# Patient Record
Sex: Female | Born: 1960
Health system: Southern US, Community
[De-identification: ages and names within clinical notes are randomized; demographics above are authoritative.]

## PROBLEM LIST (undated history)

## (undated) DIAGNOSIS — E46 Unspecified protein-calorie malnutrition: Secondary | ICD-10-CM

## (undated) DIAGNOSIS — S82832A Other fracture of upper and lower end of left fibula, initial encounter for closed fracture: Secondary | ICD-10-CM

## (undated) DIAGNOSIS — M1990 Unspecified osteoarthritis, unspecified site: Secondary | ICD-10-CM

## (undated) DIAGNOSIS — E559 Vitamin D deficiency, unspecified: Secondary | ICD-10-CM

## (undated) DIAGNOSIS — F1190 Opioid use, unspecified, uncomplicated: Secondary | ICD-10-CM

## (undated) DIAGNOSIS — I10 Essential (primary) hypertension: Secondary | ICD-10-CM

## (undated) DIAGNOSIS — S82872A Displaced pilon fracture of left tibia, initial encounter for closed fracture: Secondary | ICD-10-CM

## (undated) DIAGNOSIS — G43909 Migraine, unspecified, not intractable, without status migrainosus: Secondary | ICD-10-CM

## (undated) HISTORY — PX: LAPAROSCOPIC CHOLECYSTECTOMY: SUR755

---

## 2017-01-31 DIAGNOSIS — I10 Essential (primary) hypertension: Secondary | ICD-10-CM | POA: Diagnosis not present

## 2017-01-31 DIAGNOSIS — E7800 Pure hypercholesterolemia, unspecified: Secondary | ICD-10-CM | POA: Diagnosis not present

## 2017-01-31 DIAGNOSIS — R7309 Other abnormal glucose: Secondary | ICD-10-CM | POA: Diagnosis not present

## 2017-01-31 DIAGNOSIS — Z0000 Encounter for general adult medical examination without abnormal findings: Secondary | ICD-10-CM | POA: Diagnosis not present

## 2017-01-31 DIAGNOSIS — Z1211 Encounter for screening for malignant neoplasm of colon: Secondary | ICD-10-CM | POA: Diagnosis not present

## 2017-01-31 DIAGNOSIS — M1610 Unilateral primary osteoarthritis, unspecified hip: Secondary | ICD-10-CM | POA: Diagnosis not present

## 2017-07-25 DIAGNOSIS — Z23 Encounter for immunization: Secondary | ICD-10-CM | POA: Diagnosis not present

## 2017-07-25 DIAGNOSIS — M1610 Unilateral primary osteoarthritis, unspecified hip: Secondary | ICD-10-CM | POA: Diagnosis not present

## 2017-07-25 DIAGNOSIS — I10 Essential (primary) hypertension: Secondary | ICD-10-CM | POA: Diagnosis not present

## 2017-12-25 DIAGNOSIS — Z1231 Encounter for screening mammogram for malignant neoplasm of breast: Secondary | ICD-10-CM | POA: Diagnosis not present

## 2017-12-25 DIAGNOSIS — Z6841 Body Mass Index (BMI) 40.0 and over, adult: Secondary | ICD-10-CM | POA: Diagnosis not present

## 2017-12-25 DIAGNOSIS — M1610 Unilateral primary osteoarthritis, unspecified hip: Secondary | ICD-10-CM | POA: Diagnosis not present

## 2017-12-25 DIAGNOSIS — I10 Essential (primary) hypertension: Secondary | ICD-10-CM | POA: Diagnosis not present

## 2018-05-21 DIAGNOSIS — I10 Essential (primary) hypertension: Secondary | ICD-10-CM | POA: Diagnosis not present

## 2018-05-21 DIAGNOSIS — M25552 Pain in left hip: Secondary | ICD-10-CM | POA: Diagnosis not present

## 2018-05-21 DIAGNOSIS — Z1231 Encounter for screening mammogram for malignant neoplasm of breast: Secondary | ICD-10-CM | POA: Diagnosis not present

## 2018-07-17 DIAGNOSIS — R600 Localized edema: Secondary | ICD-10-CM | POA: Diagnosis not present

## 2018-07-17 DIAGNOSIS — R0602 Shortness of breath: Secondary | ICD-10-CM | POA: Diagnosis not present

## 2018-07-17 DIAGNOSIS — R609 Edema, unspecified: Secondary | ICD-10-CM | POA: Diagnosis not present

## 2018-07-17 DIAGNOSIS — R000 Tachycardia, unspecified: Secondary | ICD-10-CM | POA: Diagnosis not present

## 2018-07-17 DIAGNOSIS — R0902 Hypoxemia: Secondary | ICD-10-CM | POA: Diagnosis not present

## 2018-07-17 DIAGNOSIS — R52 Pain, unspecified: Secondary | ICD-10-CM | POA: Diagnosis not present

## 2018-07-17 DIAGNOSIS — Z87891 Personal history of nicotine dependence: Secondary | ICD-10-CM | POA: Diagnosis not present

## 2018-07-28 DIAGNOSIS — R609 Edema, unspecified: Secondary | ICD-10-CM | POA: Diagnosis not present

## 2018-07-28 DIAGNOSIS — R0602 Shortness of breath: Secondary | ICD-10-CM | POA: Diagnosis not present

## 2018-07-28 DIAGNOSIS — R0609 Other forms of dyspnea: Secondary | ICD-10-CM | POA: Diagnosis not present

## 2018-08-14 DIAGNOSIS — R600 Localized edema: Secondary | ICD-10-CM | POA: Diagnosis not present

## 2018-08-14 DIAGNOSIS — N6321 Unspecified lump in the left breast, upper outer quadrant: Secondary | ICD-10-CM | POA: Diagnosis not present

## 2018-08-23 DIAGNOSIS — Z79891 Long term (current) use of opiate analgesic: Secondary | ICD-10-CM | POA: Diagnosis not present

## 2018-08-23 DIAGNOSIS — M160 Bilateral primary osteoarthritis of hip: Secondary | ICD-10-CM | POA: Diagnosis not present

## 2018-08-23 DIAGNOSIS — M25572 Pain in left ankle and joints of left foot: Secondary | ICD-10-CM | POA: Diagnosis not present

## 2018-08-23 DIAGNOSIS — S8992XA Unspecified injury of left lower leg, initial encounter: Secondary | ICD-10-CM | POA: Diagnosis not present

## 2018-08-23 DIAGNOSIS — S82832A Other fracture of upper and lower end of left fibula, initial encounter for closed fracture: Secondary | ICD-10-CM | POA: Diagnosis not present

## 2018-08-23 DIAGNOSIS — X58XXXA Exposure to other specified factors, initial encounter: Secondary | ICD-10-CM | POA: Diagnosis not present

## 2018-08-23 DIAGNOSIS — R52 Pain, unspecified: Secondary | ICD-10-CM | POA: Diagnosis not present

## 2018-08-23 DIAGNOSIS — S82839A Other fracture of upper and lower end of unspecified fibula, initial encounter for closed fracture: Secondary | ICD-10-CM | POA: Diagnosis not present

## 2018-08-23 DIAGNOSIS — S82302A Unspecified fracture of lower end of left tibia, initial encounter for closed fracture: Secondary | ICD-10-CM | POA: Diagnosis not present

## 2018-08-23 DIAGNOSIS — M79662 Pain in left lower leg: Secondary | ICD-10-CM | POA: Diagnosis not present

## 2018-08-23 DIAGNOSIS — Z79899 Other long term (current) drug therapy: Secondary | ICD-10-CM | POA: Diagnosis not present

## 2018-08-23 DIAGNOSIS — E039 Hypothyroidism, unspecified: Secondary | ICD-10-CM | POA: Diagnosis not present

## 2018-08-25 ENCOUNTER — Other Ambulatory Visit: Payer: Self-pay | Admitting: Orthopedic Surgery

## 2018-08-25 ENCOUNTER — Other Ambulatory Visit: Payer: Self-pay

## 2018-08-25 ENCOUNTER — Encounter (HOSPITAL_COMMUNITY): Payer: Self-pay | Admitting: General Practice

## 2018-08-25 ENCOUNTER — Inpatient Hospital Stay (HOSPITAL_COMMUNITY)
Admission: AD | Admit: 2018-08-25 | Discharge: 2018-09-01 | DRG: 493 | Disposition: A | Discharge status: Skilled Nursing Facility | Payer: Medicare HMO | Source: Other Acute Inpatient Hospital | Attending: Orthopedic Surgery | Admitting: Orthopedic Surgery

## 2018-08-25 DIAGNOSIS — M84372A Stress fracture, left ankle, initial encounter for fracture: Secondary | ICD-10-CM | POA: Diagnosis not present

## 2018-08-25 DIAGNOSIS — M160 Bilateral primary osteoarthritis of hip: Secondary | ICD-10-CM | POA: Diagnosis present

## 2018-08-25 DIAGNOSIS — M810 Age-related osteoporosis without current pathological fracture: Secondary | ICD-10-CM | POA: Diagnosis present

## 2018-08-25 DIAGNOSIS — I10 Essential (primary) hypertension: Secondary | ICD-10-CM | POA: Diagnosis not present

## 2018-08-25 DIAGNOSIS — Z9049 Acquired absence of other specified parts of digestive tract: Secondary | ICD-10-CM | POA: Diagnosis not present

## 2018-08-25 DIAGNOSIS — S82876D Nondisplaced pilon fracture of unspecified tibia, subsequent encounter for closed fracture with routine healing: Secondary | ICD-10-CM | POA: Diagnosis not present

## 2018-08-25 DIAGNOSIS — M159 Polyosteoarthritis, unspecified: Secondary | ICD-10-CM | POA: Diagnosis not present

## 2018-08-25 DIAGNOSIS — E612 Magnesium deficiency: Secondary | ICD-10-CM | POA: Diagnosis present

## 2018-08-25 DIAGNOSIS — E559 Vitamin D deficiency, unspecified: Secondary | ICD-10-CM | POA: Diagnosis present

## 2018-08-25 DIAGNOSIS — F1190 Opioid use, unspecified, uncomplicated: Secondary | ICD-10-CM | POA: Diagnosis present

## 2018-08-25 DIAGNOSIS — S82872D Displaced pilon fracture of left tibia, subsequent encounter for closed fracture with routine healing: Secondary | ICD-10-CM | POA: Diagnosis not present

## 2018-08-25 DIAGNOSIS — S82392D Other fracture of lower end of left tibia, subsequent encounter for closed fracture with routine healing: Secondary | ICD-10-CM | POA: Diagnosis not present

## 2018-08-25 DIAGNOSIS — Z6841 Body Mass Index (BMI) 40.0 and over, adult: Secondary | ICD-10-CM

## 2018-08-25 DIAGNOSIS — R531 Weakness: Secondary | ICD-10-CM | POA: Diagnosis not present

## 2018-08-25 DIAGNOSIS — S82842A Displaced bimalleolar fracture of left lower leg, initial encounter for closed fracture: Secondary | ICD-10-CM | POA: Diagnosis not present

## 2018-08-25 DIAGNOSIS — S82892A Other fracture of left lower leg, initial encounter for closed fracture: Secondary | ICD-10-CM

## 2018-08-25 DIAGNOSIS — Z751 Person awaiting admission to adequate facility elsewhere: Secondary | ICD-10-CM | POA: Diagnosis not present

## 2018-08-25 DIAGNOSIS — S82832D Other fracture of upper and lower end of left fibula, subsequent encounter for closed fracture with routine healing: Secondary | ICD-10-CM | POA: Diagnosis not present

## 2018-08-25 DIAGNOSIS — M2550 Pain in unspecified joint: Secondary | ICD-10-CM | POA: Diagnosis not present

## 2018-08-25 DIAGNOSIS — W19XXXA Unspecified fall, initial encounter: Secondary | ICD-10-CM | POA: Diagnosis present

## 2018-08-25 DIAGNOSIS — E46 Unspecified protein-calorie malnutrition: Secondary | ICD-10-CM | POA: Diagnosis present

## 2018-08-25 DIAGNOSIS — Z87891 Personal history of nicotine dependence: Secondary | ICD-10-CM | POA: Diagnosis not present

## 2018-08-25 DIAGNOSIS — Z7401 Bed confinement status: Secondary | ICD-10-CM | POA: Diagnosis not present

## 2018-08-25 DIAGNOSIS — S82872A Displaced pilon fracture of left tibia, initial encounter for closed fracture: Secondary | ICD-10-CM | POA: Diagnosis not present

## 2018-08-25 DIAGNOSIS — E8889 Other specified metabolic disorders: Secondary | ICD-10-CM | POA: Diagnosis present

## 2018-08-25 DIAGNOSIS — T1490XA Injury, unspecified, initial encounter: Secondary | ICD-10-CM

## 2018-08-25 DIAGNOSIS — Z79891 Long term (current) use of opiate analgesic: Secondary | ICD-10-CM

## 2018-08-25 DIAGNOSIS — D62 Acute posthemorrhagic anemia: Secondary | ICD-10-CM | POA: Diagnosis not present

## 2018-08-25 DIAGNOSIS — S82832A Other fracture of upper and lower end of left fibula, initial encounter for closed fracture: Secondary | ICD-10-CM

## 2018-08-25 DIAGNOSIS — R5381 Other malaise: Secondary | ICD-10-CM | POA: Diagnosis not present

## 2018-08-25 DIAGNOSIS — Z743 Need for continuous supervision: Secondary | ICD-10-CM | POA: Diagnosis not present

## 2018-08-25 DIAGNOSIS — R262 Difficulty in walking, not elsewhere classified: Secondary | ICD-10-CM | POA: Diagnosis not present

## 2018-08-25 DIAGNOSIS — M6281 Muscle weakness (generalized): Secondary | ICD-10-CM | POA: Diagnosis not present

## 2018-08-25 DIAGNOSIS — S82392A Other fracture of lower end of left tibia, initial encounter for closed fracture: Secondary | ICD-10-CM | POA: Diagnosis not present

## 2018-08-25 HISTORY — DX: Migraine, unspecified, not intractable, without status migrainosus: G43.909

## 2018-08-25 HISTORY — DX: Displaced pilon fracture of left tibia, initial encounter for closed fracture: S82.872A

## 2018-08-25 HISTORY — DX: Vitamin D deficiency, unspecified: E55.9

## 2018-08-25 HISTORY — DX: Displaced pilon fracture of left tibia, initial encounter for closed fracture: S82.832A

## 2018-08-25 HISTORY — DX: Unspecified osteoarthritis, unspecified site: M19.90

## 2018-08-25 HISTORY — DX: Unspecified protein-calorie malnutrition: E46

## 2018-08-25 HISTORY — DX: Other fracture of upper and lower end of left fibula, initial encounter for closed fracture: S82.832A

## 2018-08-25 HISTORY — DX: Morbid (severe) obesity due to excess calories: E66.01

## 2018-08-25 HISTORY — DX: Essential (primary) hypertension: I10

## 2018-08-25 HISTORY — DX: Opioid use, unspecified, uncomplicated: F11.90

## 2018-08-25 MED ORDER — HYDROMORPHONE HCL 1 MG/ML IJ SOLN: 1.0000 mg | INTRAMUSCULAR | Status: DC | PRN

## 2018-08-25 MED ORDER — MAGNESIUM CITRATE PO SOLN: 1.0000 | Freq: Once | ORAL | Status: DC | PRN

## 2018-08-25 MED ORDER — ACETAMINOPHEN 500 MG PO TABS
1000.0000 mg | ORAL_TABLET | Freq: Four times a day (QID) | ORAL | Status: DC
  Administered 2018-08-25 – 2018-08-26 (×3): 1000 mg via ORAL
  Filled 2018-08-25 (×3): qty 2

## 2018-08-25 MED ORDER — METHOCARBAMOL 500 MG PO TABS
500.0000 mg | ORAL_TABLET | Freq: Four times a day (QID) | ORAL | Status: DC | PRN
  Administered 2018-08-25: 500 mg via ORAL
  Filled 2018-08-25: qty 1

## 2018-08-25 MED ORDER — DOCUSATE SODIUM 100 MG PO CAPS
100.0000 mg | ORAL_CAPSULE | Freq: Two times a day (BID) | ORAL | Status: DC
  Administered 2018-08-25 – 2018-08-26 (×2): 100 mg via ORAL
  Filled 2018-08-25 (×2): qty 1

## 2018-08-25 MED ORDER — ACETAMINOPHEN 325 MG PO TABS: 650.0000 mg | ORAL_TABLET | Freq: Four times a day (QID) | ORAL | Status: DC | PRN

## 2018-08-25 MED ORDER — OXYCODONE HCL 5 MG PO TABS
5.0000 mg | ORAL_TABLET | ORAL | Status: DC | PRN
  Administered 2018-08-26: 10 mg via ORAL
  Filled 2018-08-25: qty 2

## 2018-08-25 MED ORDER — BISACODYL 10 MG RE SUPP: 10.0000 mg | Freq: Every day | RECTAL | Status: DC | PRN

## 2018-08-25 MED ORDER — POTASSIUM CHLORIDE IN NACL 20-0.9 MEQ/L-% IV SOLN
INTRAVENOUS | Status: DC
  Administered 2018-08-25: 23:00:00 via INTRAVENOUS
  Filled 2018-08-25: qty 1000

## 2018-08-25 MED ORDER — SENNA 8.6 MG PO TABS
1.0000 | ORAL_TABLET | Freq: Two times a day (BID) | ORAL | Status: DC
  Administered 2018-08-25: 8.6 mg via ORAL
  Filled 2018-08-25: qty 1

## 2018-08-25 MED ORDER — INFLUENZA VAC SPLIT QUAD 0.5 ML IM SUSY: 0.5000 mL | PREFILLED_SYRINGE | INTRAMUSCULAR | Status: DC | PRN

## 2018-08-25 MED ORDER — ONDANSETRON HCL 4 MG PO TABS: 4.0000 mg | ORAL_TABLET | Freq: Four times a day (QID) | ORAL | Status: DC | PRN

## 2018-08-25 MED ORDER — ONDANSETRON HCL 4 MG/2ML IJ SOLN: 4.0000 mg | Freq: Four times a day (QID) | INTRAMUSCULAR | Status: DC | PRN

## 2018-08-25 MED ORDER — DIPHENHYDRAMINE HCL 12.5 MG/5ML PO ELIX: 12.5000 mg | ORAL_SOLUTION | ORAL | Status: DC | PRN

## 2018-08-25 MED ORDER — ACETAMINOPHEN 650 MG RE SUPP: 650.0000 mg | Freq: Four times a day (QID) | RECTAL | Status: DC | PRN

## 2018-08-25 MED ORDER — POLYETHYLENE GLYCOL 3350 17 G PO PACK: 17.0000 g | PACK | Freq: Every day | ORAL | Status: DC | PRN

## 2018-08-25 MED ORDER — TEMAZEPAM 15 MG PO CAPS: 15.0000 mg | ORAL_CAPSULE | Freq: Every evening | ORAL | Status: DC | PRN

## 2018-08-25 MED ORDER — METHOCARBAMOL 1000 MG/10ML IJ SOLN
500.0000 mg | Freq: Four times a day (QID) | INTRAVENOUS | Status: DC | PRN
  Filled 2018-08-25: qty 5

## 2018-08-25 NOTE — Progress Notes (Signed)
Received patient transfer from [facility] via ambulance, AOx4, VSS, pain at 3/10, compression wrap at left foot, oriented to room, bed controls and call, notified Dr. Dion SaucierLandau of patient's arrival to the unit, awaiting further orders.  Patient now resting on bed interviewed by Admission RN.  Will monitor.

## 2018-08-25 NOTE — H&P (Addendum)
PREOPERATIVE H&P  Chief Complaint: Left ankle pain  HPI: [name] Spears is a 57 y.o. female who complained of left ankle pain that began about a week ago, it was thought to be a "sprain".  She then twisted it further on Saturday, felt a pop, was no longer capable of bearing weight.  Presented to the emergency room.  She has been at [facility], and is just been transferred here.  Pain is rated as moderate to severe with weightbearing, better with rest, she has been splinted.  Denies any other injuries.  She is normally able to ambulate but has known arthritis in multiple joints.  Past Medical History:  Diagnosis Date  . Arthritis    "hips" (08/25/2018)  . Hypertension   . Migraine    "2-3/year" (08/25/2018)   Past Surgical History:  Procedure Laterality Date  . CESAREAN SECTION  1991  . LAPAROSCOPIC CHOLECYSTECTOMY     Social History   Socioeconomic History  . Marital status: Divorced    Spouse name: Not on file  . Number of children: Not on file  . Years of education: Not on file  . Highest education level: Not on file  Occupational History  . Not on file  Social Needs  . Financial resource strain: Not on file  . Food insecurity:    Worry: Not on file    Inability: Not on file  . Transportation needs:    Medical: Not on file    Non-medical: Not on file  Tobacco Use  . Smoking status: Former Smoker    Packs/day: 1.00    Years: 10.00    Pack years: 10.00    Types: Cigarettes    Last attempt to quit: 1986    Years since quitting: 33.9  . Smokeless tobacco: Never Used  Substance and Sexual Activity  . Alcohol use: Not Currently    Comment: 08/25/2018 "nothing since 1990s"  . Drug use: Never  . Sexual activity: Not Currently  Lifestyle  . Physical activity:    Days per week: Not on file    Minutes per session: Not on file  . Stress: Not on file  Relationships  . Social connections:    Talks on phone: Not on file    Gets together: Not on file    Attends  religious service: Not on file    Active member of club or organization: Not on file    Attends meetings of clubs or organizations: Not on file    Relationship status: Not on file  Other Topics Concern  . Not on file  Social History Narrative  . Not on file   History reviewed. No pertinent family history. Allergies not on file Prior to Admission medications   Not on File     Positive ROS: All other systems have been reviewed and were otherwise negative with the exception of those mentioned in the HPI and as above.  Physical Exam:  Estimated body mass index is 71.02 kg/m as calculated from the following:   Height as of this encounter: 5\' 6"  (1.676 m).   Weight as of this encounter: 199.6 kg.  General: Alert, no acute distress Cardiovascular: No pedal edema in the right lower extremity Respiratory: No cyanosis, no use of accessory musculature GI: No organomegaly, abdomen is soft and non-tender Skin: No lesions in the area of chief complaint by report, splint is intact Neurologic: Sensation intact distally Psychiatric: Patient is competent for consent with normal mood and affect Lymphatic: Unable to be  determined.  MUSCULOSKELETAL: Left ankle is currently splinted, EHL and FHL are intact.  X-rays are able to be viewed through canopy, although not available through epic.  These demonstrate a medial malleolus fracture as well as a distal fibula fracture with displacement and intra-articular involvement.  Assessment: Left bimalleolar ankle fracture with coexisting morbid obesity   Plan: Plan for Procedure(s): OPEN REDUCTION INTERNAL FIXATION (ORIF) ANKLE FRACTURE  The risks benefits and alternatives were discussed with the patient including but not limited to the risks of nonoperative treatment, versus surgical intervention including infection, bleeding, nerve injury, malunion, nonunion, the need for revision surgery, hardware prominence, hardware failure, the need for hardware  removal, blood clots, cardiopulmonary complications, morbidity, mortality, among others, and they were willing to proceed.    Plan for surgery tomorrow with either Dr. Carola Frost, myself, or 1 of the members of the orthopedic trauma team.  She will require inpatient admission, because she is unable to be discharged home, due to her morbid obesity, and is going to require at least 2 midnight hospital stay, and may require nursing home placement.  It will be extremely challenging to achieve nonweightbearing status for the duration of her recovery, we will have to do the best we can.   Eulas Post, MD Cell [phone]   08/25/2018 11:07 PM

## 2018-08-25 NOTE — Progress Notes (Signed)
Patient admitted from [facility], request for transfer due to morbid obesity, requesting a "higher level of care".  Admission orders placed, plan for surgical intervention possibly tomorrow, n.p.o. after midnight.  Full history and physical to follow.  Eulas PostJoshua P Nickolaus Bordelon, MD

## 2018-08-26 ENCOUNTER — Inpatient Hospital Stay (HOSPITAL_COMMUNITY): Payer: Medicare HMO | Admitting: Certified Registered Nurse Anesthetist

## 2018-08-26 ENCOUNTER — Inpatient Hospital Stay (HOSPITAL_COMMUNITY): Payer: Medicare HMO

## 2018-08-26 ENCOUNTER — Encounter (HOSPITAL_COMMUNITY)
Admission: AD | Disposition: A | Discharge status: Skilled Nursing Facility | Payer: Self-pay | Source: Other Acute Inpatient Hospital | Attending: Orthopedic Surgery

## 2018-08-26 ENCOUNTER — Encounter (HOSPITAL_COMMUNITY): Payer: Self-pay | Admitting: Certified Registered Nurse Anesthetist

## 2018-08-26 HISTORY — PX: ORIF ANKLE FRACTURE: SHX5408

## 2018-08-26 LAB — BASIC METABOLIC PANEL
Anion gap: 7 (ref 5–15)
BUN: 9 mg/dL (ref 6–20)
CALCIUM: 8.6 mg/dL — AB (ref 8.9–10.3)
CO2: 28 mmol/L (ref 22–32)
Chloride: 104 mmol/L (ref 98–111)
Creatinine, Ser: 0.62 mg/dL (ref 0.44–1.00)
GFR calc Af Amer: >60 mL/min (ref >60)
GFR calc non Af Amer: >60 mL/min (ref >60)
Glucose, Bld: 108 mg/dL — ABNORMAL HIGH (ref 70–99)
Potassium: 3.3 mmol/L — ABNORMAL LOW (ref 3.5–5.1)
Sodium: 139 mmol/L (ref 135–145)

## 2018-08-26 LAB — CBC
HCT: 39.4 % (ref 36.0–46.0)
Hemoglobin: 12 g/dL (ref 12.0–15.0)
MCH: 32.1 pg (ref 26.0–34.0)
MCHC: 30.5 g/dL (ref 30.0–36.0)
MCV: 105.3 fL — ABNORMAL HIGH (ref 80.0–100.0)
NRBC: 0 % (ref 0.0–0.2)
Platelets: 153 10*3/uL (ref 150–400)
RBC: 3.74 MIL/uL — ABNORMAL LOW (ref 3.87–5.11)
RDW: 13.8 % (ref 11.5–15.5)
WBC: 5.4 10*3/uL (ref 4.0–10.5)

## 2018-08-26 LAB — HIV ANTIBODY (ROUTINE TESTING W REFLEX): HIV Screen 4th Generation wRfx: NONREACTIVE

## 2018-08-26 LAB — SURGICAL PCR SCREEN
MRSA, PCR: NEGATIVE
Staphylococcus aureus: POSITIVE — AB

## 2018-08-26 SURGERY — OPEN REDUCTION INTERNAL FIXATION (ORIF) ANKLE FRACTURE
Anesthesia: General | Laterality: Left

## 2018-08-26 MED ORDER — ONDANSETRON HCL 4 MG/2ML IJ SOLN: 4.0000 mg | Freq: Four times a day (QID) | INTRAMUSCULAR | Status: DC | PRN

## 2018-08-26 MED ORDER — ROCURONIUM BROMIDE 10 MG/ML (PF) SYRINGE
PREFILLED_SYRINGE | INTRAVENOUS | Status: DC | PRN
  Administered 2018-08-26: 20 mg via INTRAVENOUS
  Administered 2018-08-26: 15 mg via INTRAVENOUS
  Administered 2018-08-26: 50 mg via INTRAVENOUS
  Administered 2018-08-26: 25 mg via INTRAVENOUS

## 2018-08-26 MED ORDER — MORPHINE SULFATE (PF) 2 MG/ML IV SOLN: 0.5000 mg | INTRAVENOUS | Status: DC | PRN

## 2018-08-26 MED ORDER — CEFAZOLIN SODIUM-DEXTROSE 2-4 GM/100ML-% IV SOLN
2.0000 g | Freq: Four times a day (QID) | INTRAVENOUS | Status: AC
  Administered 2018-08-26 – 2018-08-27 (×3): 2 g via INTRAVENOUS
  Filled 2018-08-26 (×4): qty 100

## 2018-08-26 MED ORDER — DEXTROSE 5 % IV SOLN
3.0000 g | INTRAVENOUS | Status: AC
  Administered 2018-08-26: 3 g via INTRAVENOUS
  Filled 2018-08-26: qty 3

## 2018-08-26 MED ORDER — CHLORHEXIDINE GLUCONATE 4 % EX LIQD
60.0000 mL | Freq: Once | CUTANEOUS | Status: AC
  Administered 2018-08-26: 4 via TOPICAL
  Filled 2018-08-26: qty 15

## 2018-08-26 MED ORDER — SUCCINYLCHOLINE CHLORIDE 20 MG/ML IJ SOLN
INTRAMUSCULAR | Status: DC | PRN
  Administered 2018-08-26: 180 mg via INTRAVENOUS

## 2018-08-26 MED ORDER — CHLORHEXIDINE GLUCONATE CLOTH 2 % EX PADS
6.0000 | MEDICATED_PAD | Freq: Every day | CUTANEOUS | Status: DC
  Administered 2018-08-26: 6 via TOPICAL

## 2018-08-26 MED ORDER — LACTATED RINGERS IV SOLN
INTRAVENOUS | Status: DC | PRN
  Administered 2018-08-26 (×2): via INTRAVENOUS

## 2018-08-26 MED ORDER — PROMETHAZINE HCL 25 MG/ML IJ SOLN: 6.2500 mg | INTRAMUSCULAR | Status: DC | PRN

## 2018-08-26 MED ORDER — HYDROCODONE-ACETAMINOPHEN 5-325 MG PO TABS
1.0000 | ORAL_TABLET | ORAL | Status: DC | PRN
  Administered 2018-08-26 – 2018-08-28 (×3): 2 via ORAL
  Administered 2018-08-29: 1 via ORAL
  Administered 2018-08-30 (×2): 2 via ORAL
  Filled 2018-08-26: qty 2
  Filled 2018-08-26: qty 1
  Filled 2018-08-26 (×4): qty 2
  Filled 2018-08-26: qty 1

## 2018-08-26 MED ORDER — DOCUSATE SODIUM 100 MG PO CAPS
100.0000 mg | ORAL_CAPSULE | Freq: Two times a day (BID) | ORAL | Status: DC
  Administered 2018-08-26 – 2018-09-01 (×9): 100 mg via ORAL
  Filled 2018-08-26 (×11): qty 1

## 2018-08-26 MED ORDER — PROPOFOL 10 MG/ML IV BOLUS
INTRAVENOUS | Status: AC
  Filled 2018-08-26: qty 20

## 2018-08-26 MED ORDER — HYDROMORPHONE HCL 1 MG/ML IJ SOLN
INTRAMUSCULAR | Status: AC
  Filled 2018-08-26: qty 1

## 2018-08-26 MED ORDER — EPHEDRINE 5 MG/ML INJ
INTRAVENOUS | Status: AC
  Filled 2018-08-26: qty 10

## 2018-08-26 MED ORDER — POVIDONE-IODINE 10 % EX SWAB: 2.0000 "application " | Freq: Once | CUTANEOUS | Status: DC

## 2018-08-26 MED ORDER — CEFAZOLIN SODIUM 1 G IJ SOLR
INTRAMUSCULAR | Status: AC
  Filled 2018-08-26: qty 20

## 2018-08-26 MED ORDER — ROCURONIUM BROMIDE 50 MG/5ML IV SOSY
PREFILLED_SYRINGE | INTRAVENOUS | Status: AC
  Filled 2018-08-26: qty 15

## 2018-08-26 MED ORDER — FENTANYL CITRATE (PF) 250 MCG/5ML IJ SOLN
INTRAMUSCULAR | Status: DC | PRN
  Administered 2018-08-26 (×5): 50 ug via INTRAVENOUS

## 2018-08-26 MED ORDER — ACETAMINOPHEN 500 MG PO TABS
500.0000 mg | ORAL_TABLET | Freq: Two times a day (BID) | ORAL | Status: DC
  Administered 2018-08-26 – 2018-09-01 (×8): 500 mg via ORAL
  Filled 2018-08-26 (×8): qty 1

## 2018-08-26 MED ORDER — FENTANYL CITRATE (PF) 250 MCG/5ML IJ SOLN
INTRAMUSCULAR | Status: AC
  Filled 2018-08-26: qty 5

## 2018-08-26 MED ORDER — SUGAMMADEX SODIUM 500 MG/5ML IV SOLN
INTRAVENOUS | Status: AC
  Filled 2018-08-26: qty 5

## 2018-08-26 MED ORDER — ROCURONIUM BROMIDE 50 MG/5ML IV SOSY
PREFILLED_SYRINGE | INTRAVENOUS | Status: AC
  Filled 2018-08-26: qty 5

## 2018-08-26 MED ORDER — LIDOCAINE 2% (20 MG/ML) 5 ML SYRINGE
INTRAMUSCULAR | Status: AC
  Filled 2018-08-26: qty 5

## 2018-08-26 MED ORDER — MUPIROCIN 2 % EX OINT
1.0000 "application " | TOPICAL_OINTMENT | Freq: Two times a day (BID) | CUTANEOUS | Status: DC
  Administered 2018-08-26: 1 via NASAL
  Filled 2018-08-26: qty 22

## 2018-08-26 MED ORDER — DEXAMETHASONE SODIUM PHOSPHATE 10 MG/ML IJ SOLN
INTRAMUSCULAR | Status: DC | PRN
  Administered 2018-08-26: 10 mg via INTRAVENOUS

## 2018-08-26 MED ORDER — HYDROMORPHONE HCL 1 MG/ML IJ SOLN
INTRAMUSCULAR | Status: AC
  Administered 2018-08-26: 0.5 mg via INTRAVENOUS
  Filled 2018-08-26: qty 1

## 2018-08-26 MED ORDER — EPHEDRINE SULFATE 50 MG/ML IJ SOLN
INTRAMUSCULAR | Status: DC | PRN
  Administered 2018-08-26: 5 mg via INTRAVENOUS

## 2018-08-26 MED ORDER — HYDROMORPHONE HCL 1 MG/ML IJ SOLN
0.2500 mg | INTRAMUSCULAR | Status: DC | PRN
  Administered 2018-08-26 (×4): 0.5 mg via INTRAVENOUS

## 2018-08-26 MED ORDER — METOCLOPRAMIDE HCL 5 MG/ML IJ SOLN: 5.0000 mg | Freq: Three times a day (TID) | INTRAMUSCULAR | Status: DC | PRN

## 2018-08-26 MED ORDER — PROPOFOL 10 MG/ML IV BOLUS
INTRAVENOUS | Status: DC | PRN
  Administered 2018-08-26: 180 mg via INTRAVENOUS

## 2018-08-26 MED ORDER — MEPERIDINE HCL 50 MG/ML IJ SOLN: 6.2500 mg | INTRAMUSCULAR | Status: DC | PRN

## 2018-08-26 MED ORDER — ONDANSETRON HCL 4 MG/2ML IJ SOLN
INTRAMUSCULAR | Status: DC | PRN
  Administered 2018-08-26: 4 mg via INTRAVENOUS

## 2018-08-26 MED ORDER — MIDAZOLAM HCL 2 MG/2ML IJ SOLN
INTRAMUSCULAR | Status: AC
  Filled 2018-08-26: qty 2

## 2018-08-26 MED ORDER — METHOCARBAMOL 1000 MG/10ML IJ SOLN
1000.0000 mg | Freq: Four times a day (QID) | INTRAVENOUS | Status: DC | PRN
  Filled 2018-08-26: qty 10

## 2018-08-26 MED ORDER — PANTOPRAZOLE SODIUM 40 MG PO TBEC
40.0000 mg | DELAYED_RELEASE_TABLET | Freq: Every day | ORAL | Status: DC
  Administered 2018-08-26 – 2018-09-01 (×7): 40 mg via ORAL
  Filled 2018-08-26 (×7): qty 1

## 2018-08-26 MED ORDER — POTASSIUM CHLORIDE IN NACL 20-0.9 MEQ/L-% IV SOLN
INTRAVENOUS | Status: DC
  Administered 2018-08-27: 04:00:00 via INTRAVENOUS
  Filled 2018-08-26: qty 1000

## 2018-08-26 MED ORDER — LACTATED RINGERS IV SOLN: INTRAVENOUS | Status: DC

## 2018-08-26 MED ORDER — MIDAZOLAM HCL 2 MG/2ML IJ SOLN
INTRAMUSCULAR | Status: DC | PRN
  Administered 2018-08-26: 2 mg via INTRAVENOUS

## 2018-08-26 MED ORDER — LACTATED RINGERS IV SOLN
INTRAVENOUS | Status: DC
  Administered 2018-08-26: 13:00:00 via INTRAVENOUS

## 2018-08-26 MED ORDER — ALBUTEROL SULFATE HFA 108 (90 BASE) MCG/ACT IN AERS
INHALATION_SPRAY | RESPIRATORY_TRACT | Status: AC
  Filled 2018-08-26: qty 6.7

## 2018-08-26 MED ORDER — SUGAMMADEX SODIUM 200 MG/2ML IV SOLN
INTRAVENOUS | Status: DC | PRN
  Administered 2018-08-26: 450 mg via INTRAVENOUS

## 2018-08-26 MED ORDER — LIDOCAINE 2% (20 MG/ML) 5 ML SYRINGE
INTRAMUSCULAR | Status: DC | PRN
  Administered 2018-08-26: 80 mg via INTRAVENOUS

## 2018-08-26 MED ORDER — METHOCARBAMOL 500 MG PO TABS
500.0000 mg | ORAL_TABLET | Freq: Four times a day (QID) | ORAL | Status: DC | PRN
  Administered 2018-08-27: 1000 mg via ORAL
  Administered 2018-08-27 – 2018-08-31 (×5): 500 mg via ORAL
  Filled 2018-08-26 (×4): qty 1
  Filled 2018-08-26: qty 2
  Filled 2018-08-26: qty 1

## 2018-08-26 MED ORDER — 0.9 % SODIUM CHLORIDE (POUR BTL) OPTIME
TOPICAL | Status: DC | PRN
  Administered 2018-08-26: 1000 mL

## 2018-08-26 MED ORDER — HYDROCODONE-ACETAMINOPHEN 7.5-325 MG PO TABS
1.0000 | ORAL_TABLET | ORAL | Status: DC | PRN
  Administered 2018-08-27 – 2018-08-29 (×6): 2 via ORAL
  Administered 2018-08-31 (×2): 1 via ORAL
  Filled 2018-08-26 (×9): qty 2

## 2018-08-26 MED ORDER — METOPROLOL TARTRATE 25 MG PO TABS
25.0000 mg | ORAL_TABLET | Freq: Every day | ORAL | Status: DC
  Administered 2018-08-26: 25 mg via ORAL
  Filled 2018-08-26: qty 1

## 2018-08-26 MED ORDER — ONDANSETRON HCL 4 MG/2ML IJ SOLN
INTRAMUSCULAR | Status: AC
  Filled 2018-08-26: qty 2

## 2018-08-26 MED ORDER — ONDANSETRON HCL 4 MG PO TABS: 4.0000 mg | ORAL_TABLET | Freq: Four times a day (QID) | ORAL | Status: DC | PRN

## 2018-08-26 MED ORDER — DEXAMETHASONE SODIUM PHOSPHATE 10 MG/ML IJ SOLN
INTRAMUSCULAR | Status: AC
  Filled 2018-08-26: qty 1

## 2018-08-26 MED ORDER — ALBUTEROL SULFATE HFA 108 (90 BASE) MCG/ACT IN AERS
INHALATION_SPRAY | RESPIRATORY_TRACT | Status: DC | PRN
  Administered 2018-08-26: 6 via RESPIRATORY_TRACT

## 2018-08-26 MED ORDER — METOCLOPRAMIDE HCL 5 MG PO TABS: 5.0000 mg | ORAL_TABLET | Freq: Three times a day (TID) | ORAL | Status: DC | PRN

## 2018-08-26 MED ORDER — ACETAMINOPHEN 325 MG PO TABS: 325.0000 mg | ORAL_TABLET | Freq: Four times a day (QID) | ORAL | Status: DC | PRN

## 2018-08-26 MED ORDER — SUCCINYLCHOLINE CHLORIDE 200 MG/10ML IV SOSY
PREFILLED_SYRINGE | INTRAVENOUS | Status: AC
  Filled 2018-08-26: qty 10

## 2018-08-26 SURGICAL SUPPLY — 81 items
BANDAGE ACE 4X5 VEL STRL LF (GAUZE/BANDAGES/DRESSINGS) IMPLANT
BANDAGE ACE 6X5 VEL STRL LF (GAUZE/BANDAGES/DRESSINGS) IMPLANT
BANDAGE ESMARK 6X9 LF (GAUZE/BANDAGES/DRESSINGS) ×1 IMPLANT
BIT DRILL 2.5X2.75 QC CALB (BIT) ×2 IMPLANT
BIT DRILL 2.7 (BIT) ×1
BIT DRILL 2.7MM (BIT) ×1 IMPLANT
BIT DRILL STD 2.0MM (DRILL) ×1 IMPLANT
BNDG ESMARK 6X9 LF (GAUZE/BANDAGES/DRESSINGS) ×2
BNDG GAUZE ELAST 4 BULKY (GAUZE/BANDAGES/DRESSINGS) ×2 IMPLANT
BRUSH SCRUB SURG 4.25 DISP (MISCELLANEOUS) ×4 IMPLANT
COVER MAYO STAND STRL (DRAPES) ×2 IMPLANT
COVER SURGICAL LIGHT HANDLE (MISCELLANEOUS) ×2 IMPLANT
COVER WAND RF STERILE (DRAPES) ×2 IMPLANT
DRAPE C-ARM 42X72 X-RAY (DRAPES) ×2 IMPLANT
DRAPE C-ARMOR (DRAPES) ×2 IMPLANT
DRAPE HALF SHEET 40X57 (DRAPES) ×2 IMPLANT
DRAPE U-SHAPE 47X51 STRL (DRAPES) ×2 IMPLANT
DRILL BIT 2.7MM (BIT) ×1
DRILL STANDARD 2.0MM (DRILL) ×2
DRSG EMULSION OIL 3X3 NADH (GAUZE/BANDAGES/DRESSINGS) IMPLANT
DRSG MEPITEL 4X7.2 (GAUZE/BANDAGES/DRESSINGS) ×2 IMPLANT
ELECT REM PT RETURN 9FT ADLT (ELECTROSURGICAL) ×2
ELECTRODE REM PT RTRN 9FT ADLT (ELECTROSURGICAL) ×1 IMPLANT
GAUZE SPONGE 4X4 12PLY STRL (GAUZE/BANDAGES/DRESSINGS) ×4 IMPLANT
GLOVE BIO SURGEON STRL SZ7.5 (GLOVE) ×2 IMPLANT
GLOVE BIO SURGEON STRL SZ8 (GLOVE) ×2 IMPLANT
GLOVE BIOGEL PI IND STRL 7.5 (GLOVE) ×1 IMPLANT
GLOVE BIOGEL PI IND STRL 8 (GLOVE) ×1 IMPLANT
GLOVE BIOGEL PI INDICATOR 7.5 (GLOVE) ×1
GLOVE BIOGEL PI INDICATOR 8 (GLOVE) ×1
GOWN STRL REUS W/ TWL LRG LVL3 (GOWN DISPOSABLE) ×2 IMPLANT
GOWN STRL REUS W/ TWL XL LVL3 (GOWN DISPOSABLE) ×1 IMPLANT
GOWN STRL REUS W/TWL LRG LVL3 (GOWN DISPOSABLE) ×2
GOWN STRL REUS W/TWL XL LVL3 (GOWN DISPOSABLE) ×1
K-WIRE ACE 1.6X6 (WIRE) ×4
KIT BASIN OR (CUSTOM PROCEDURE TRAY) ×2 IMPLANT
KIT TURNOVER KIT B (KITS) ×2 IMPLANT
KWIRE ACE 1.6X6 (WIRE) ×2 IMPLANT
MANIFOLD NEPTUNE II (INSTRUMENTS) ×2 IMPLANT
NEEDLE HYPO 21X1.5 SAFETY (NEEDLE) IMPLANT
NS IRRIG 1000ML POUR BTL (IV SOLUTION) ×2 IMPLANT
PACK GENERAL/GYN (CUSTOM PROCEDURE TRAY) ×2 IMPLANT
PACK ORTHO EXTREMITY (CUSTOM PROCEDURE TRAY) ×2 IMPLANT
PAD ARMBOARD 7.5X6 YLW CONV (MISCELLANEOUS) ×4 IMPLANT
PAD CAST 4YDX4 CTTN HI CHSV (CAST SUPPLIES) IMPLANT
PADDING CAST COTTON 4X4 STRL (CAST SUPPLIES)
PADDING CAST COTTON 6X4 STRL (CAST SUPPLIES) IMPLANT
PLATE 6H 142 LT MED DIST TIB (Plate) ×2 IMPLANT
PLATE DIST FIB LOCK 6 HOLE (Plate) ×2 IMPLANT
SCREW 3.5X10 (Screw) ×2 IMPLANT
SCREW 3.5X16MM (Screw) ×2 IMPLANT
SCREW BN 2.7X16X3.5XST NS (Screw) ×2 IMPLANT
SCREW CORT 2.5X20X2.7XST SM (Screw) ×1 IMPLANT
SCREW CORT T15 TPR 55X3.5XST (Screw) ×2 IMPLANT
SCREW CORTICAL 2.7X20MM (Screw) ×1 IMPLANT
SCREW CORTICAL 3.5MM 26MM (Screw) ×2 IMPLANT
SCREW CORTICAL 3.5MM 36MM (Screw) ×2 IMPLANT
SCREW CORTICAL 3.5X55MM (Screw) ×2 IMPLANT
SCREW LOCK 3.5X46 DIST TIB (Screw) ×2 IMPLANT
SCREW LOCK CORT STAR 3.5X28 (Screw) ×2 IMPLANT
SCREW LOCK CORT STAR 3.5X42 (Screw) ×2 IMPLANT
SCREW LOCK PT ST 2.7X26 (Screw) ×4 IMPLANT
SCREW LOCK PT ST 3.5X20 (Screw) ×2 IMPLANT
SCREW LOCKING 2.7MMX20MM (Screw) ×4 IMPLANT
SCREW LOCKING 2.7MMX22MM (Screw) ×2 IMPLANT
SCREW LOCKING 2.7X24MM (Screw) ×4 IMPLANT
SPONGE LAP 18X18 X RAY DECT (DISPOSABLE) ×2 IMPLANT
STAPLER VISISTAT 35W (STAPLE) IMPLANT
SUCTION FRAZIER HANDLE 10FR (MISCELLANEOUS) ×1
SUCTION TUBE FRAZIER 10FR DISP (MISCELLANEOUS) ×1 IMPLANT
SUT ETHILON 3 0 PS 1 (SUTURE) ×8 IMPLANT
SUT PDS AB 2-0 CT1 27 (SUTURE) IMPLANT
SUT VIC AB 2-0 CT1 27 (SUTURE) ×1
SUT VIC AB 2-0 CT1 TAPERPNT 27 (SUTURE) ×1 IMPLANT
SUT VIC AB 3-0 CT1 27 (SUTURE) ×2
SUT VIC AB 3-0 CT1 TAPERPNT 27 (SUTURE) ×2 IMPLANT
TOWEL OR 17X24 6PK STRL BLUE (TOWEL DISPOSABLE) ×2 IMPLANT
TOWEL OR 17X26 10 PK STRL BLUE (TOWEL DISPOSABLE) ×4 IMPLANT
TUBE CONNECTING 12X1/4 (SUCTIONS) ×2 IMPLANT
UNDERPAD 30X30 (UNDERPADS AND DIAPERS) ×2 IMPLANT
WATER STERILE IRR 1000ML POUR (IV SOLUTION) ×2 IMPLANT

## 2018-08-26 NOTE — Brief Op Note (Signed)
08/26/2018  6:19 PM   DICTATION: .Other Dictation: Dictation Number [id number]

## 2018-08-26 NOTE — Anesthesia Procedure Notes (Signed)
Procedure Name: Intubation Date/Time: 08/26/2018 3:18 PM Performed by: Inda Coke, CRNA Pre-anesthesia Checklist: Patient identified, Emergency Drugs available, Suction available and Patient being monitored Patient Re-evaluated:Patient Re-evaluated prior to induction Oxygen Delivery Method: Circle System Utilized Preoxygenation: Pre-oxygenation with 100% oxygen Induction Type: IV induction Ventilation: Mask ventilation without difficulty and Oral airway inserted - appropriate to patient size Laryngoscope Size: Mac and 4 Grade View: Grade I Tube type: Oral Tube size: 7.5 mm Number of attempts: 1 Airway Equipment and Method: Stylet and Oral airway Placement Confirmation: ETT inserted through vocal cords under direct vision,  positive ETCO2 and breath sounds checked- equal and bilateral Secured at: 22 cm Tube secured with: Tape Dental Injury: Teeth and Oropharynx as per pre-operative assessment

## 2018-08-26 NOTE — Progress Notes (Signed)
Anesthesia MD to bedside.  Ok to have more pain medicine and MD ok with pt returning to floor bed after having chest discomfort.  MD states EKG is unchanged from previous.

## 2018-08-26 NOTE — Consult Note (Signed)
Orthopaedic Trauma Service Consultation  Reason for Consult: Left ankle, plafond fracture Referring Physician: Dorthula Nettles, MD  [name] Spears is an 57 y.o. female.  HPI: Morbidly obese female with 10 days of ankle pain culminating in an acute break three days ago. Denies numbness and tingling or other injury.  Past Medical History:  Diagnosis Date  . Arthritis    "hips" (08/25/2018)  . Hypertension   . Migraine    "2-3/year" (08/25/2018)    Past Surgical History:  Procedure Laterality Date  . CESAREAN SECTION  1991  . LAPAROSCOPIC CHOLECYSTECTOMY      History reviewed. No pertinent family history.  Social History:  reports that she quit smoking about 33 years ago. Her smoking use included cigarettes. She has a 10.00 pack-year smoking history. She has never used smokeless tobacco. She reports that she drank alcohol. She reports that she does not use drugs.  Allergies: No Known Allergies  Medications:  Prior to Admission:  Medications Prior to Admission  Medication Sig Dispense Refill Last Dose  . acetaminophen (TYLENOL) 500 MG tablet Take 1,000 mg by mouth every 6 (six) hours as needed for mild pain.   unk at prn  . Aspirin-Acetaminophen-Caffeine (GOODY HEADACHE PO) Take 1 Package by mouth daily as needed (pain).   unk at prn  . metoprolol tartrate (LOPRESSOR) 25 MG tablet Take 25 mg by mouth daily.  1 08/25/2018 at 12pm  . traMADol (ULTRAM) 50 MG tablet Take 50 mg by mouth every 4 (four) hours as needed for pain.  3 unk at prn    Results for orders placed or performed during the hospital encounter of 08/25/18 (from the past 48 hour(s))  Surgical pcr screen     Status: Abnormal   Collection Time: 08/25/18 11:54 PM  Result Value Ref Range   MRSA, PCR NEGATIVE NEGATIVE   Staphylococcus aureus POSITIVE (A) NEGATIVE    Comment: (NOTE) The Xpert SA Assay (FDA approved for NASAL specimens in patients 103 years of age and older), is one component of a comprehensive surveillance  program. It is not intended to diagnose infection nor to guide or monitor treatment. Performed at [facility], [address] [place], [place] [address]   CBC     Status: Abnormal   Collection Time: 08/26/18 11:47 AM  Result Value Ref Range   WBC 5.4 4.0 - 10.5 K/uL   RBC 3.74 (L) 3.87 - 5.11 MIL/uL   Hemoglobin 12.0 12.0 - 15.0 g/dL   HCT 29.5 62.1 - 30.8 %   MCV 105.3 (H) 80.0 - 100.0 fL   MCH 32.1 26.0 - 34.0 pg   MCHC 30.5 30.0 - 36.0 g/dL   RDW 65.7 84.6 - 96.2 %   Platelets 153 150 - 400 K/uL   nRBC 0.0 0.0 - 0.2 %    Comment: Performed at [facility], [address] [place], [place] [address]  Basic metabolic panel     Status: Abnormal   Collection Time: 08/26/18 11:47 AM  Result Value Ref Range   Sodium 139 135 - 145 mmol/L   Potassium 3.3 (L) 3.5 - 5.1 mmol/L   Chloride 104 98 - 111 mmol/L   CO2 28 22 - 32 mmol/L   Glucose, Bld 108 (H) 70 - 99 mg/dL   BUN 9 6 - 20 mg/dL   Creatinine, Ser 1.32 0.44 - 1.00 mg/dL   Calcium 8.6 (L) 8.9 - 10.3 mg/dL   GFR calc non Af Amer >60 >60 mL/min  GFR calc Af Amer >60 >60 mL/min   Anion gap 7 5 - 15    Comment: Performed at [facility], [address] [place], [place] [address]    No results found.  ROS No recent fever, bleeding abnormalities, urologic dysfunction, GI problems, or weight gain. Blood pressure 139/73, pulse 70, temperature 98.1 F (36.7 C), temperature source Oral, resp. rate 16, height 5\' 6"  (1.676 m), weight (!) 199.8 kg, SpO2 100 %. Physical Exam  A&O x 4 Edentulous, [place]/AT No audible wheezing LLE Dressing intact, clean, dry  Edema/ swelling controlled  Sens: DPN, SPN, TN intact  Motor: EHL, FHL, and lessor toe ext and flex all intact grossly  Brisk cap refill, warm to touch   Assessment/Plan: Left medial shear tibial plafond fracture with tension sided fracture of the fibula.  I discussed with the patient the risks and benefits of surgery, including the possibility of  infection, nerve injury, vessel injury, wound breakdown, arthritis, symptomatic hardware, DVT/ PE, loss of motion, malunion, nonunion, and need for further surgery among others.  We also specifically discussed the alternative forms of fixation such as internal fixation or pinning and the elevated risk of soft tissue breakdown that could lead to amputation.  She acknowledged these risks and wished to proceed.   Myrene GalasMichael Jayten Gabbard, MD [facility], [facility] [phone]  08/26/2018  2:21 PM

## 2018-08-26 NOTE — Progress Notes (Signed)
Spoke with Dr. Clemens CatholicWitman, no labs needed for pre-op.

## 2018-08-26 NOTE — Transfer of Care (Signed)
Immediate Anesthesia Transfer of Care Note  Patient: [name] Spears  Procedure(s) Performed: OPEN REDUCTION INTERNAL FIXATION (ORIF) ANKLE FRACTURE (Left )  Patient Location: PACU  Anesthesia Type:General  Level of Consciousness: awake and alert   Airway & Oxygen Therapy: Patient Spontanous Breathing and Patient connected to face mask oxygen  Post-op Assessment: Report given to RN and Post -op Vital signs reviewed and stable  Post vital signs: Reviewed and stable  Last Vitals:  Vitals Value Taken Time  BP 123/86 08/26/2018  6:25 PM  Temp 36.5 C 08/26/2018  6:25 PM  Pulse 163 08/26/2018  6:26 PM  Resp 20 08/26/2018  6:29 PM  SpO2 0 % 08/26/2018  6:26 PM  Vitals shown include unvalidated device data.  Last Pain:  Vitals:   08/26/18 1257  TempSrc: Oral  PainSc:       Patients Stated Pain Goal: 5 (08/26/18 0846)  Complications: No apparent anesthesia complications

## 2018-08-26 NOTE — Anesthesia Preprocedure Evaluation (Signed)
Anesthesia Evaluation  Patient identified by MRN, date of birth, ID band Patient awake    Reviewed: Allergy & Precautions, NPO status , Patient's Chart, lab work & pertinent test results  Airway Mallampati: II  TM Distance: >3 FB Neck ROM: Full    Dental  (+) Upper Dentures, Lower Dentures   Pulmonary former smoker,    breath sounds clear to auscultation       Cardiovascular hypertension,  Rhythm:Regular Rate:Normal     Neuro/Psych  Headaches, negative psych ROS   GI/Hepatic negative GI ROS, Neg liver ROS,   Endo/Other  negative endocrine ROS  Renal/GU negative Renal ROS     Musculoskeletal  (+) Arthritis , Osteoarthritis,    Abdominal (+) + obese,   Peds  Hematology negative hematology ROS (+)   Anesthesia Other Findings   Reproductive/Obstetrics                             Anesthesia Physical Anesthesia Plan  ASA: IV  Anesthesia Plan: General   Post-op Pain Management:    Induction: Intravenous, Rapid sequence and Cricoid pressure planned  PONV Risk Score and Plan: 4 or greater and Ondansetron, Dexamethasone, Midazolam and Scopolamine patch - Pre-op  Airway Management Planned: Oral ETT  Additional Equipment: None  Intra-op Plan:   Post-operative Plan: Extubation in OR  Informed Consent: I have reviewed the patients History and Physical, chart, labs and discussed the procedure including the risks, benefits and alternatives for the proposed anesthesia with the patient or authorized representative who has indicated his/her understanding and acceptance.   Dental advisory given  Plan Discussed with: CRNA  Anesthesia Plan Comments:         Anesthesia Quick Evaluation

## 2018-08-26 NOTE — Op Note (Signed)
[name], [name] MEDICAL RECORD [id number] ACCOUNT [id number] DATE OF BIRTH:19-Oct-1960 FACILITY: [facility] LOCATION: [facility] PHYSICIAN:Charlotta Lapaglia H. Chinonso Linker, MD  OPERATIVE REPORT  DATE OF PROCEDURE:  08/26/2018  PREOPERATIVE DIAGNOSES:  Left tibial plafond fracture and a tension stress fracture of the lateral malleolus.  POSTOPERATIVE DIAGNOSES:  Left tibial plafond fracture and a tension stress fracture of the lateral malleolus.  PROCEDURES:   1.  Open reduction, internal fixation of left tibial plafond fracture. 2.  Second open reduction, internal fixation of left lateral malleolus tension stress fracture.  SURGEON:  Myrene GalasMichael Hanna Ra, MD  ASSISTANT: 1.  Montez MoritaKeith Paul PA-C, 2.  PA student.  ANESTHESIA:  General.  COMPLICATIONS:  None.  TOURNIQUET:  None.  ESTIMATED BLOOD LOSS:  Fifty mL.   PATIENT DISPOSITION:  To PACU.  CONDITION:  Stable.  BRIEF SUMMARY AND INDICATIONS FOR PROCEDURE:  The patient is a 57 year old female with a BMI of 71.   The patient was initially seen and evaluated by Dr. Corbin AdeJustin Lee because of the patient's tension sided stress fracture resulting in tibial plafond  fracture in this morbidly obese patient.  I requested a consultation and further management.  I discussed, specially trained orthopedic traumatologist.  I saw and discussed with the patient the risks and benefits of surgical fixation including the  possibility of alternative forms of fixation such as Steinman pins for the tibiotalar joint or external fixation.  The patient also frankly discussed her weight and the importance of noncompliance would likely result in loss of reduction, loss of  fixation, infection and could be very easily limb threatening.  She acknowledged these risks and did wish to proceed.  BRIEF SUMMARY OF PROCEDURE:  The patient was taken to the operating room where general anesthesia was induced.  She had to be positioned with the help of a total of 8 staff.  We applied arm  boards to the lower aspect of the table to support her legs as  they could not be brought together on the table because of the patient's body habitus.  Furthermore extensions were placed on either side through the mid-section and additional arm boards proximally.  This required extensive increase in operative time.   It also made it very difficult to obtain a lateral x-ray of the ankle given the radius of the C-arm, which I had difficulty navigating the extenders on both sides.  This also made it very difficult to control for sterility of draped in the C-arm, which  we were meticulous to do in spite of the difficulty, but we did limit our lateral x-rays as a result.  Four assistants were required to prep the leg and assist with draping, again because of the patient's body habitus.  We positioned several bumps on the left hip and tried to use the bone foam as best as possible.  We brought in C-arm to identify the  correct starting point and made a lateral incision protecting the periosteum and applying fixation distally.  One assistant controlled rotation of the leg while the other applied a valgus force to reduce the tension side of the stress fracture.  I then  placed an eccentric screw proximally, securing fixation in the fibula.  I did have an anatomic position of the plate on her a very small fibula proximally, but as the screw was placed essentially and tightened down with the difficulty holding this in the  plate off the bone somewhat, it did result in a slight anterior translation of the plate.  This did not  require [place] to reposition the plate, but rather to change the trajectory of my fixation more posteriorly for additional proximal fixation.  Reduction  was excellent.  Additional screws were placed proximally such that 3 bicortical screws and then 5 locked smaller 2.7 screws.  This improved the reduction along the medial side.  I then took the smallest available Biomet ALPS plate and slit it from  distal  incision proximally.  I secured a standard fixation.  After pinning it for position, I applied a standard fixation in the proximal aspect of the plate to buttress it.  The patient's bone quality was poor in spite of her large size.  I used a standard  conical fixation distally with excellent compression across the joint followed by 1 locked screw.  I did add a supplemental locked screw between the standard fixation proximally as well because of the patient's poor bone quality.  I did not take down the  periosteum, which had not torn in spite of the patient's impaction fracture through the medial shoulder of the plafond.  Wounds were irrigated thoroughly and closed in a standard fashion after AP, mortise and lateral view showed appropriate reduction, hardware placement, trajectory and length.  There were no complications during the procedure.  Closure was difficult given  the patient's thin skin and we used 0 Vicryl for the deep fatty layer, 2-0 Vicryl and a 3-0 nylon horizontal mattress and vertical mattress to obtain closure in this very thin skin and large soft tissue envelope.    Montez Morita, PA-C, assisted me throughout as well as another assistant, both of whom were necessary.  PROGNOSIS:  The patient is at very high risk for complications.  Certainly this could involve a stress fracture on the contralateral side, worsening mobility, noncompliance, loss of fixation, infection and arthritis, loss of motion,  adjacent stress  fracture and the need for further procedures, among others.    Her social situation is equally concerning as the patient is at home with her daughter who she states has been bedridden for 2 years and actually is more overweight then the patient herself.  The patient's daughter does have a fiance who is a caretaker  for her daughter, but does not participate directly with regard to the patient.    Consequently SNF placement is anticipated and has been discussed at  length with the patient.  AN/NUANCE  D:08/26/2018 T:08/26/2018 JOB:[id number]

## 2018-08-26 NOTE — Progress Notes (Signed)
Attempted report x2 RN just received another patient from us and is asking for us to wait for 15 more minutes.  Will call back

## 2018-08-26 NOTE — Progress Notes (Signed)
Patient complaining of medial chest pressure. Dr. Mal AmabileBrock made aware and order for EKG received.

## 2018-08-27 LAB — COMPREHENSIVE METABOLIC PANEL
ALT: 24 U/L (ref 0–44)
AST: 38 U/L (ref 15–41)
Albumin: 3.1 g/dL — ABNORMAL LOW (ref 3.5–5.0)
Alkaline Phosphatase: 54 U/L (ref 38–126)
Anion gap: 12 (ref 5–15)
BUN: 9 mg/dL (ref 6–20)
CO2: 27 mmol/L (ref 22–32)
Calcium: 9 mg/dL (ref 8.9–10.3)
Chloride: 101 mmol/L (ref 98–111)
Creatinine, Ser: 0.7 mg/dL (ref 0.44–1.00)
GFR calc Af Amer: >60 mL/min (ref >60)
GFR calc non Af Amer: >60 mL/min (ref >60)
Glucose, Bld: 157 mg/dL — ABNORMAL HIGH (ref 70–99)
POTASSIUM: 3.9 mmol/L (ref 3.5–5.1)
Sodium: 140 mmol/L (ref 135–145)
Total Bilirubin: 0.7 mg/dL (ref 0.3–1.2)
Total Protein: 6.2 g/dL — ABNORMAL LOW (ref 6.5–8.1)

## 2018-08-27 LAB — CBC
HCT: 39.9 % (ref 36.0–46.0)
Hemoglobin: 12.3 g/dL (ref 12.0–15.0)
MCH: 32.2 pg (ref 26.0–34.0)
MCHC: 30.8 g/dL (ref 30.0–36.0)
MCV: 104.5 fL — ABNORMAL HIGH (ref 80.0–100.0)
Platelets: 174 10*3/uL (ref 150–400)
RBC: 3.82 MIL/uL — ABNORMAL LOW (ref 3.87–5.11)
RDW: 13.3 % (ref 11.5–15.5)
WBC: 9 10*3/uL (ref 4.0–10.5)
nRBC: 0 % (ref 0.0–0.2)

## 2018-08-27 LAB — TSH: TSH: 1.262 u[IU]/mL (ref 0.350–4.500)

## 2018-08-27 LAB — HEMOGLOBIN A1C
HEMOGLOBIN A1C: 5.1 % (ref 4.8–5.6)
Mean Plasma Glucose: 99.67 mg/dL

## 2018-08-27 LAB — PREALBUMIN: Prealbumin: 14.4 mg/dL — ABNORMAL LOW (ref 18–38)

## 2018-08-27 LAB — PHOSPHORUS: Phosphorus: 3.8 mg/dL (ref 2.5–4.6)

## 2018-08-27 LAB — MAGNESIUM: Magnesium: 1.4 mg/dL — ABNORMAL LOW (ref 1.7–2.4)

## 2018-08-27 NOTE — Evaluation (Signed)
Physical Therapy Evaluation Patient Details Name: [name] Spears MRN: [id number] DOB: 10/18/1960 Today's Date: 08/27/2018   History of Present Illness  Pt is a 57 y.o. morbidly obese female who presented with L ankle fx. She underwent ORIF 08/26/18.   Clinical Impression  Pt admitted with above diagnosis. Pt currently with functional limitations due to the deficits listed below (see PT Problem List). PTA pt lived at home, modified independent mobility using cane. On eval, she required +2-3 max/total assist bed mobility. See below for further details. Pt will benefit from skilled PT to increase their independence and safety with mobility to allow discharge to the venue listed below.       Follow Up Recommendations SNF    Equipment Recommendations  Other (comment)(defer to next venue)    Recommendations for Other Services       Precautions / Restrictions Precautions Precautions: Fall Restrictions LLE Weight Bearing: Non weight bearing      Mobility  Bed Mobility Overal bed mobility: Needs Assistance Bed Mobility: Supine to Sit;Sit to Supine     Supine to sit: Max assist;+2 for physical assistance;HOB elevated Sit to supine: Total assist(+3)   General bed mobility comments: With attempt to transfer to EOB, pt had to be emergently returned to supine due to sliding off EOB. Helicopter method used. Pt had maxislide on top of air mattress. The combination made her begin to slide of the bed after the initial 90 degree pivot in bed for transition to sitting. +3 required for repositioning in bed.   Transfers                 General transfer comment: unable  Ambulation/Gait             General Gait Details: unable  Stairs            Wheelchair Mobility    Modified Rankin (Stroke Patients Only)       Balance                                             Pertinent Vitals/Pain Pain Assessment: Faces Faces Pain Scale: Hurts even more Pain  Location: LLE with movement Pain Descriptors / Indicators: Grimacing;Guarding Pain Intervention(s): Limited activity within patient's tolerance;Repositioned    Home Living Family/patient expects to be discharged to:: Private residence Living Arrangements: Children;Non-relatives/Friends Available Help at Discharge: Friend(s);Available PRN/intermittently Type of Home: Mobile home Home Access: Ramped entrance     Home Layout: One level Home Equipment: Cane - single point;Wheelchair - manual      Prior Function Level of Independence: Independent with assistive device(s)         Comments: Ambulates with cane. Drives. Uses scooter in grocery store. Pt lives with her daughter who is bedbound (x 2 years) and the daughter's fiance. The fiance is the primary caretaker for the daughter.      Hand Dominance        Extremity/Trunk Assessment   Upper Extremity Assessment Upper Extremity Assessment: Defer to OT evaluation    Lower Extremity Assessment Lower Extremity Assessment: (difficult to assess due to body habitus)       Communication   Communication: No difficulties  Cognition Arousal/Alertness: Awake/alert Behavior During Therapy: WFL for tasks assessed/performed Overall Cognitive Status: Within Functional Limits for tasks assessed  General Comments      Exercises     Assessment/Plan    PT Assessment Patient needs continued PT services  PT Problem List Decreased mobility;Decreased strength;Decreased activity tolerance;Obesity;Pain;Decreased knowledge of use of DME       PT Treatment Interventions DME instruction;Therapeutic activities;Therapeutic exercise;Patient/family education;Balance training;Functional mobility training;Neuromuscular re-education    PT Goals (Current goals can be found in the Care Plan section)  Acute Rehab PT Goals Patient Stated Goal: get better PT Goal Formulation: With  patient Time For Goal Achievement: 09/10/18 Potential to Achieve Goals: Fair    Frequency Min 3X/week   Barriers to discharge Decreased caregiver support      Co-evaluation PT/OT/SLP Co-Evaluation/Treatment: Yes Reason for Co-Treatment: For patient/therapist safety PT goals addressed during session: Mobility/safety with mobility         AM-PAC PT "6 Clicks" Mobility  Outcome Measure Help needed turning from your back to your side while in a flat bed without using bedrails?: A Lot Help needed moving from lying on your back to sitting on the side of a flat bed without using bedrails?: A Lot Help needed moving to and from a bed to a chair (including a wheelchair)?: Total Help needed standing up from a chair using your arms (e.g., wheelchair or bedside chair)?: Total Help needed to walk in hospital room?: Total Help needed climbing 3-5 steps with a railing? : Total 6 Click Score: 8    End of Session   Activity Tolerance: Patient tolerated treatment well Patient left: in bed;with call bell/phone within reach Nurse Communication: Mobility status;Need for lift equipment PT Visit Diagnosis: Other abnormalities of gait and mobility (R26.89);Pain Pain - Right/Left: Left Pain - part of body: Ankle and joints of foot    Time: 1319-1340 PT Time Calculation (min) (ACUTE ONLY): 21 min   Charges:   PT Evaluation $PT Eval Moderate Complexity: 1 Mod          Aida RaiderWendy Deondra Wigger, PT  Office # [phone] Pager [phone]   Ilda FoilGarrow, Tarvaris Puglia Rene 08/27/2018, 2:02 PM

## 2018-08-27 NOTE — NC FL2 (Signed)
MEDICAID FL2 LEVEL OF CARE SCREENING TOOL     IDENTIFICATION  Patient Name: [name] Spears Birthdate: Nov 06, 1960 Sex: female Admission Date (Current Location): 08/25/2018  [facility] [place]  [employer]   Facility and Address:  [facility], [address], [place], [place] [address]      Provider Number: [id number]  Attending Physician Name and Address:  Myrene GalasHandy, Michael, MD  Relative Name and Phone Number:   Regan RakersJimmy Campbell, brother, [phone]    Current Level of Care: Hospital Recommended Level of Care: Skilled Nursing Facility Prior Approval Number:    Date Approved/Denied:   PASRR Number: [id number]  Discharge Plan: SNF    Current Diagnoses: Patient Active Problem List   Diagnosis Date Noted  . Closed left ankle fracture 08/25/2018    Orientation RESPIRATION BLADDER Height & Weight        Normal Continent, Indwelling catheter Weight: (!) 440 lb 7.7 oz (199.8 kg) Height:  5\' 6"  (167.6 cm)  BEHAVIORAL SYMPTOMS/MOOD NEUROLOGICAL BOWEL NUTRITION STATUS      Continent Diet  AMBULATORY STATUS COMMUNICATION OF NEEDS Skin   Extensive Assist Verbally Surgical wounds(incision on leg with compression wrap)                       Personal Care Assistance Level of Assistance  Bathing, Feeding, Dressing Bathing Assistance: Maximum assistance Feeding assistance: Independent Dressing Assistance: Maximum assistance     Functional Limitations Info  Hearing, Sight, Speech Sight Info: Adequate Hearing Info: Adequate Speech Info: Adequate    SPECIAL CARE FACTORS FREQUENCY  PT (By licensed PT), OT (By licensed OT)     PT Frequency: 5x week OT Frequency: 5x week            Contractures Contractures Info: Not present    Additional Factors Info  Code Status, Allergies Code Status Info: Full Code Allergies Info: No Known Allergies           Current Medications (08/27/2018):  This is the current hospital active  medication list Current Facility-Administered Medications  Medication Dose Route Frequency Provider Last Rate Last Dose  . 0.9 % NaCl with KCl 20 mEq/ L  infusion   Intravenous Continuous Montez Moritaaul, Keith, PA-C 50 mL/hr at 08/27/18 [id number]    . acetaminophen (TYLENOL) tablet 325-650 mg  325-650 mg Oral Q6H PRN Montez MoritaPaul, Keith, PA-C      . acetaminophen (TYLENOL) tablet 500 mg  500 mg Oral Q12H Montez Moritaaul, Keith, PA-C   500 mg at 08/27/18 1042  . docusate sodium (COLACE) capsule 100 mg  100 mg Oral BID Montez Moritaaul, Keith, PA-C   100 mg at 08/27/18 1042  . HYDROcodone-acetaminophen (NORCO) 7.5-325 MG per tablet 1-2 tablet  1-2 tablet Oral Q4H PRN Montez MoritaPaul, Keith, PA-C   2 tablet at 08/27/18 1042  . HYDROcodone-acetaminophen (NORCO/VICODIN) 5-325 MG per tablet 1-2 tablet  1-2 tablet Oral Q4H PRN Montez MoritaPaul, Keith, PA-C   2 tablet at 08/26/18 2350  . methocarbamol (ROBAXIN) tablet 500-1,000 mg  500-1,000 mg Oral Q6H PRN Montez MoritaPaul, Keith, PA-C   500 mg at 08/27/18 1042   Or  . methocarbamol (ROBAXIN) 1,000 mg in dextrose 5 % 50 mL IVPB  1,000 mg Intravenous Q6H PRN Montez MoritaPaul, Keith, PA-C      . metoCLOPramide (REGLAN) tablet 5-10 mg  5-10 mg Oral Q8H PRN Montez MoritaPaul, Keith, PA-C       Or  . metoCLOPramide (REGLAN) injection 5-10 mg  5-10 mg Intravenous Q8H PRN Montez MoritaPaul, Keith,  PA-C      . morphine 2 MG/ML injection 0.5-1 mg  0.5-1 mg Intravenous Q2H PRN Montez Morita, PA-C      . ondansetron [facility]) tablet 4 mg  4 mg Oral Q6H PRN Montez Morita, PA-C       Or  . ondansetron [facility]) injection 4 mg  4 mg Intravenous Q6H PRN Montez Morita, PA-C      . pantoprazole (PROTONIX) EC tablet 40 mg  40 mg Oral Daily Montez Morita, PA-C   40 mg at 08/27/18 1042     Discharge Medications: Please see discharge summary for a list of discharge medications.  Relevant Imaging Results:  Relevant Lab Results:   Additional Information SS#[id number] [place], [place]

## 2018-08-27 NOTE — Progress Notes (Signed)
Orthopaedic Trauma Service (OTS)  1 Day Post-Op Procedure(s) (LRB): OPEN REDUCTION INTERNAL FIXATION (ORIF) ANKLE FRACTURE (Left)  Subjective: Patient reports pain as 5 on 0-10 scale.    Objective: Current Vitals Blood pressure (!) 179/74, pulse 99, temperature 98.6 F (37 C), temperature source Oral, resp. rate 17, height 5\' 6"  (1.676 m), weight (!) 199.8 kg, SpO2 94 %. Vital signs in last 24 hours: Temp:  [97.5 F (36.4 C)-98.9 F (37.2 C)] 98.6 F (37 C) (12/04 0525) Pulse Rate:  [70-108] 99 (12/04 0525) Resp:  [10-21] 17 (12/04 0525) BP: (123-179)/(72-86) 179/74 (12/04 0525) SpO2:  [92 %-100 %] 94 % (12/04 0525)  Intake/Output from previous day: 12/03 0701 - 12/04 0700 In: 2547.9 [P.O.:300; I.V.:2047.9; IV Piggyback:200] Out: 1400 [Urine:1350; Blood:50]  LABS Recent Labs    08/26/18 1147 08/27/18 0300  HGB 12.0 12.3   Recent Labs    08/26/18 1147 08/27/18 0300  WBC 5.4 9.0  RBC 3.74* 3.82*  HCT 39.4 39.9  PLT 153 174   Recent Labs    08/26/18 1147 08/27/18 0300  NA 139 140  K 3.3* 3.9  CL 104 101  CO2 28 27  BUN 9 9  CREATININE 0.62 0.70  GLUCOSE 108* 157*  CALCIUM 8.6* 9.0   No results for input(s): LABPT, INR in the last 72 hours.   Physical Exam LLE  Dressing intact, clean, dry  Edema/ swelling controlled  Sens: DPN, SPN, TN intact  Motor: EHL, FHL, and lessor toe ext and flex all intact grossly  Brisk cap refill, warm to touch  Assessment/Plan: 1 Day Post-Op Procedure(s) (LRB): OPEN REDUCTION INTERNAL FIXATION (ORIF) ANKLE FRACTURE (Left) 1. PT/OT with NWB on LLE; please focus on bed to chair transfers with bedside commode if safe so that foley may be removed; may leave for another day if one more day would make a significant difference in ability to accomplish this; replacement of foley would be very difficult in this scenario 2. DVT proph Lovenox 3. DNF placement required for this patient  Myrene GalasMichael Jameila Keeny, MD [facility] [phone] (p)

## 2018-08-27 NOTE — Anesthesia Postprocedure Evaluation (Signed)
Anesthesia Post Note  Patient: Mliss SaxDonna Easterly  Procedure(s) Performed: OPEN REDUCTION INTERNAL FIXATION (ORIF) ANKLE FRACTURE (Left )     Patient location during evaluation: PACU Anesthesia Type: General Level of consciousness: awake and alert Pain management: pain level controlled Vital Signs Assessment: post-procedure vital signs reviewed and stable Respiratory status: spontaneous breathing, nonlabored ventilation, respiratory function stable and patient connected to nasal cannula oxygen Cardiovascular status: blood pressure returned to baseline and stable Postop Assessment: no apparent nausea or vomiting Anesthetic complications: no    Last Vitals:  Vitals:   08/27/18 0122 08/27/18 0525  BP: (!) 159/81 (!) 179/74  Pulse: (!) 108 99  Resp: 17 17  Temp: 37.2 C 37 C  SpO2: 92% 94%    Last Pain:  Vitals:   08/27/18 0808  TempSrc:   PainSc: 5                  Beryle Lathehomas E Brock

## 2018-08-27 NOTE — Evaluation (Signed)
Occupational Therapy Evaluation Patient Details Name: [name] Spears MRN: [id number] DOB: [birth year] Today's Date: 08/27/2018    History of Present Illness Pt is a 57 y.o. morbidly obese female who presented with L ankle fx. She underwent ORIF 08/26/18.    Clinical Impression   Patient is s/p L ORIF ankle surgery resulting in functional limitations due to the deficits listed below (see OT problem list). Pt currently bed level care only due to inability to sit EOB with x2 therapist this session. Pt sliding off EOB and needed total +3 to return to supine.  Patient will benefit from skilled OT acutely to increase independence and safety with ADLS to allow discharge SNF.     Follow Up Recommendations  SNF(bariatric needs/ lift/ chair/ w/c / airmattress)    Equipment Recommendations  Wheelchair cushion (measurements OT);Wheelchair (measurements OT);Hospital bed;3 in 1 bedside commode(likely drop arm bariatric for all DME needs)    Recommendations for Other Services       Precautions / Restrictions Precautions Precautions: Fall Restrictions Weight Bearing Restrictions: Yes LLE Weight Bearing: Non weight bearing      Mobility Bed Mobility Overal bed mobility: Needs Assistance Bed Mobility: Supine to Sit;Sit to Supine     Supine to sit: Max assist;+2 for physical assistance;HOB elevated Sit to supine: Total assist(+3)   General bed mobility comments: With attempt to transfer to EOB, pt had to be emergently returned to supine due to sliding off EOB. Helicopter method used. Pt had maxislide on top of air mattress. The combination made her begin to slide of the bed after the initial 90 degree pivot in bed for transition to sitting. +3 required for repositioning in bed.   Transfers                 General transfer comment: unable    Balance                                           ADL either performed or assessed with clinical judgement   ADL Overall  ADL's : Needs assistance/impaired Eating/Feeding: Set up   Grooming: Set up   Upper Body Bathing: Moderate assistance   Lower Body Bathing: Total assistance       Lower Body Dressing: Total assistance     Toilet Transfer Details (indicate cue type and reason): unable to attempt at this time and advise use of bed pan and foley until able to progress           General ADL Comments: pt with EOB sitting and near fall at eob.      Vision         Perception     Praxis      Pertinent Vitals/Pain Pain Assessment: Faces Faces Pain Scale: Hurts even more Pain Location: LLE with movement Pain Descriptors / Indicators: Grimacing;Guarding Pain Intervention(s): Monitored during session;Repositioned;Premedicated before session     Hand Dominance Right   Extremity/Trunk Assessment Upper Extremity Assessment Upper Extremity Assessment: Defer to OT evaluation   Lower Extremity Assessment Lower Extremity Assessment: (difficult to assess due to body habitus)       Communication Communication Communication: No difficulties   Cognition Arousal/Alertness: Awake/alert Behavior During Therapy: WFL for tasks assessed/performed Overall Cognitive Status: Within Functional Limits for tasks assessed  General Comments  spoke with RN and director regarding need for frequent position changes due to inabilty to reposition OOB or EOB at this time. pt high fall risk.  [facility] so that lift could be utilize to help with OOB.     Exercises     Shoulder Instructions      Home Living Family/patient expects to be discharged to:: Private residence Living Arrangements: Children;Non-relatives/Friends Available Help at Discharge: Friend(s);Available PRN/intermittently Type of Home: Mobile home Home Access: Ramped entrance     Home Layout: One level               Home Equipment: Cane - single  point;Wheelchair - manual   Additional Comments: daughter that is bed bound x2 years and her fiance live in the home. pt does not care for the daughter or the 2 cats. pt does drive and requires a power cart in the grocery store      Prior Functioning/Environment Level of Independence: Independent with assistive device(s)        Comments: Ambulates with cane. Drives. Uses scooter in grocery store. Pt lives with her daughter who is bedbound (x 2 years) and the daughter's fiance. The fiance is the primary caretaker for the daughter.         OT Problem List: Decreased strength;Decreased range of motion;Decreased activity tolerance;Impaired balance (sitting and/or standing);Decreased safety awareness;Decreased knowledge of use of DME or AE;Decreased knowledge of precautions;Obesity;Pain      OT Treatment/Interventions: Self-care/ADL training;Therapeutic exercise;Neuromuscular education;Energy conservation;DME and/or AE instruction;Manual therapy;Modalities;Therapeutic activities;Patient/family education;Balance training    OT Goals(Current goals can be found in the care plan section) Acute Rehab OT Goals Patient Stated Goal: get better OT Goal Formulation: With patient Time For Goal Achievement: 09/10/18 Potential to Achieve Goals: Good  OT Frequency: Min 3X/week   Barriers to D/C: Decreased caregiver support          Co-evaluation PT/OT/SLP Co-Evaluation/Treatment: Yes Reason for Co-Treatment: Complexity of the patient's impairments (multi-system involvement);Necessary to address cognition/behavior during functional activity;For patient/therapist safety;To address functional/ADL transfers PT goals addressed during session: Mobility/safety with mobility OT goals addressed during session: Strengthening/ROM;Proper use of Adaptive equipment and DME;ADL's and self-care      AM-PAC OT "6 Clicks" Daily Activity     Outcome Measure Help from another person eating meals?: A Little Help  from another person taking care of personal grooming?: A Little Help from another person toileting, which includes using toliet, bedpan, or urinal?: Total Help from another person bathing (including washing, rinsing, drying)?: A Lot Help from another person to put on and taking off regular upper body clothing?: A Lot Help from another person to put on and taking off regular lower body clothing?: Total 6 Click Score: 12   End of Session Nurse Communication: Mobility status;Precautions  Activity Tolerance: Patient tolerated treatment well Patient left: in bed;with call bell/phone within reach;with SCD's reapplied(all bed rails up)  OT Visit Diagnosis: Unsteadiness on feet (R26.81);Muscle weakness (generalized) (M62.81)                Time: 1610-96041319-1338 OT Time Calculation (min): 19 min Charges:  OT General Charges $OT Visit: 1 Visit OT Evaluation $OT Eval High Complexity: 1 High   Mateo FlowBrynn Bryar Rennie, OTR/L  Acute Rehabilitation Services Pager: [phone] Office: [phone] .   Mateo FlowBrynn Marisol Glazer 08/27/2018, 3:33 PM

## 2018-08-27 NOTE — Social Work (Signed)
CSW acknowledging consult for SNF placement. Will follow for therapy recommendations.   Zohar Maroney, MSW, LCSWA [place] Clinical Social Work [phone]   

## 2018-08-27 NOTE — Progress Notes (Signed)
OT NOTE  Pt requesting placement at SNF [facility]    Mateo FlowBrynn Caedmon Louque, OTR/L  Acute Rehabilitation Services Pager: [phone] Office: [phone] .

## 2018-08-27 NOTE — Progress Notes (Signed)
Per ortho tech trapeze and overhead frame is not available at this moment until proper training for new equipment is given.

## 2018-08-28 ENCOUNTER — Encounter (HOSPITAL_COMMUNITY): Payer: Self-pay | Admitting: Orthopedic Surgery

## 2018-08-28 DIAGNOSIS — E559 Vitamin D deficiency, unspecified: Secondary | ICD-10-CM | POA: Diagnosis present

## 2018-08-28 HISTORY — DX: Vitamin D deficiency, unspecified: E55.9

## 2018-08-28 HISTORY — DX: Morbid (severe) obesity due to excess calories: E66.01

## 2018-08-28 LAB — PTH, INTACT AND CALCIUM
Calcium, Total (PTH): 9 mg/dL (ref 8.7–10.2)
PTH: 45 pg/mL (ref 15–65)

## 2018-08-28 LAB — VITAMIN D 25 HYDROXY (VIT D DEFICIENCY, FRACTURES): Vit D, 25-Hydroxy: 5.2 ng/mL — ABNORMAL LOW (ref 30.0–100.0)

## 2018-08-28 LAB — CALCITRIOL (1,25 DI-OH VIT D): Vit D, 1,25-Dihydroxy: 46.5 pg/mL (ref 19.9–79.3)

## 2018-08-28 LAB — CALCIUM, IONIZED: Calcium, Ionized, Serum: 4.9 mg/dL (ref 4.5–5.6)

## 2018-08-28 NOTE — Clinical Social Work Note (Signed)
Clinical Social Work Assessment  Patient Details  Name: [name] Spears MRN: [id number] Date of Birth: October 13, 1960  Date of referral:  08/28/18               Reason for consult:  Facility Placement, Discharge Planning                Permission sought to share information with:  Family Supports, Magazine features editoracility Contact Representative Permission granted to share information::  Yes, Verbal Permission Granted  Name::     [name] Spears  Agency::  SNFs  Relationship::  daughter  Contact Information:  [phone]  Housing/Transportation Living arrangements for the past 2 months:  Single Family Home Source of Information:  Patient Patient Interpreter Needed:  None Criminal Activity/Legal Involvement Pertinent to Current Situation/Hospitalization:  No - Comment as needed Significant Relationships:  Adult Children, Siblings Lives with:  Self Do you feel safe going back to the place where you live?  Yes Need for family participation in patient care:  Yes (Comment)  Care giving concerns:  Pt lives at home [place] in [place]. Pt weighs 440lbs and needs assistance with ADLs and IADLs. Pt recommended for SNF.    Social Worker assessment / plan:  CSW spoke with pt at bedside. Introduced self, role, and reason for visit.   Pt from home with daughter and daughters fiance. Pt states otherwise she does not have major assistance at home. Pt has worked with therapies and spoken with her surgical team and agrees to SNF before returning home with her daughter. CSW explained referral process and barriers to placement at preferred facility Journalist, newspaper(insurance contract and bariatric bed). Pt aware of offers, provided with CMS list and asked to speak with liaison from [facility] facilities.   Will f/u with pt to obtain preference and start insurance authorization.   Employment status:  Unemployed Database administratornsurance information:  Managed Medicare PT Recommendations:  Skilled Nursing Facility, 24 Hour Supervision Information  / Referral to community resources:  Skilled Nursing Facility  Patient/Family's Response to care:  Pt amenable to speaking with CSW. Interested in SNF as a bridge to get home.  Patient/Family's Understanding of and Emotional Response to Diagnosis, Current Treatment, and Prognosis:  Pt states understanding of diagnosis, current treatment and prognosis. Pt amenable to SNF, understands barriers. Pt was quiet and calm, emotionally appropriate throughout assessment.   Emotional Assessment Appearance:  Appears stated age Attitude/Demeanor/Rapport:  Gracious Affect (typically observed):  Accepting, Adaptable, Calm, Quiet Orientation:  Oriented to Self, Oriented to Place, Oriented to  Time Alcohol / Substance use:  Not Applicable(former smoker ) Psych involvement (Current and /or in the community):  No (Comment)  Discharge Needs  Concerns to be addressed:  Care Coordination Readmission within the last 30 days:  No Current discharge risk:  Dependent with Mobility, Lives alone Barriers to Discharge:  English as a second language teachernsurance Authorization, Continued Medical Work up   Dillard'ssabel H Addasyn Spears, LCSWA 08/28/2018, 10:18 AM

## 2018-08-28 NOTE — Social Work (Signed)
Pt has agreed to private room at [facility] [place].  [employer] authorization initiated by [facility], can take 2-3 days for approval.  Octavio GravesIsabel Tyresha Fede, MSW, [facility] SurgeryCSWA [place] Clinical Social Work [phone]

## 2018-08-28 NOTE — Progress Notes (Addendum)
Orthopedic Trauma Service Progress Note  Patient ID: Mliss SaxDonna Rackham MRN: [id number] DOB/AGE: [birth year] 57 y.o.  Subjective:  Doing well, no complaints Did ok with PT/OT yesterday but unable to transfer    Review of Systems  Constitutional: Negative for chills and fever.  Respiratory: Negative for shortness of breath and wheezing.   Cardiovascular: Negative for chest pain and palpitations.  Gastrointestinal: Negative for nausea and vomiting.    Objective:   VITALS:   Vitals:   08/27/18 0525 08/27/18 1440 08/27/18 2204 08/28/18 0457  BP: (!) 179/74 (!) 118/55 129/69 129/73  Pulse: 99 91 85 96  Resp: 17 18 16 16   Temp: 98.6 F (37 C) 98.6 F (37 C) 99.3 F (37.4 C) 98.7 F (37.1 C)  TempSrc: Oral Oral Oral Oral  SpO2: 94% 94% 95% 96%  Weight:      Height:        Estimated body mass index is 71.1 kg/m as calculated from the following:   Height as of this encounter: 5\' 6"  (1.676 m).   Weight as of this encounter: 199.8 kg.   Intake/Output      12/04 0701 - 12/05 0700 12/05 0701 - 12/06 0700   P.O. 1300    I.V. (mL/kg) 58.5 (0.3)    IV Piggyback     Total Intake(mL/kg) 1358.5 (6.8)    Urine (mL/kg/hr) 2075 (0.4)    Blood     Total Output 2075    Net -716.5           LABS  Results for Mliss SaxLLEY, Leatrice (MRN [id number]) as of 08/28/2018 09:12  Ref. Range 08/27/2018 03:00  Sodium Latest Ref Range: 135 - 145 mmol/L 140  Potassium Latest Ref Range: 3.5 - 5.1 mmol/L 3.9  Chloride Latest Ref Range: 98 - 111 mmol/L 101  CO2 Latest Ref Range: 22 - 32 mmol/L 27  Glucose Latest Ref Range: 70 - 99 mg/dL 562157 (H)  Mean Plasma Glucose Latest Units: mg/dL 13.0899.67  BUN Latest Ref Range: 6 - 20 mg/dL 9  Creatinine Latest Ref Range: 0.44 - 1.00 mg/dL 6.570.70  Calcium Latest Ref Range: 8.9 - 10.3 mg/dL 9.0  Anion gap Latest Ref Range: 5 - 15  12  Calcium, Ionized, Serum Latest Ref Range: 4.5 - 5.6 mg/dL 4.9    Phosphorus Latest Ref Range: 2.5 - 4.6 mg/dL 3.8  Magnesium Latest Ref Range: 1.7 - 2.4 mg/dL 1.4 (L)  Alkaline Phosphatase Latest Ref Range: 38 - 126 U/L 54  Albumin Latest Ref Range: 3.5 - 5.0 g/dL 3.1 (L)  AST Latest Ref Range: 15 - 41 U/L 38  ALT Latest Ref Range: 0 - 44 U/L 24  Total Protein Latest Ref Range: 6.5 - 8.1 g/dL 6.2 (L)  Total Bilirubin Latest Ref Range: 0.3 - 1.2 mg/dL 0.7  PREALBUMIN Latest Ref Range: 18 - 38 mg/dL 84.614.4 (L)  GFR, Est Non African American Latest Ref Range: >60 mL/min >60  GFR, Est African American Latest Ref Range: >60 mL/min >60  Vitamin D, 25-Hydroxy Latest Ref Range: 30.0 - 100.0 ng/mL 5.2 (L)  WBC Latest Ref Range: 4.0 - 10.5 K/uL 9.0  RBC Latest Ref Range: 3.87 - 5.11 MIL/uL 3.82 (L)  Hemoglobin Latest Ref Range: 12.0 - 15.0 g/dL 96.212.3  HCT Latest Ref Range: 36.0 - 46.0 % 39.9  MCV  Latest Ref Range: 80.0 - 100.0 fL 104.5 (H)  MCH Latest Ref Range: 26.0 - 34.0 pg 32.2  MCHC Latest Ref Range: 30.0 - 36.0 g/dL 16.1  RDW Latest Ref Range: 11.5 - 15.5 % 13.3  Platelets Latest Ref Range: 150 - 400 K/uL 174  nRBC Latest Ref Range: 0.0 - 0.2 % 0.0  Hemoglobin A1C Latest Ref Range: 4.8 - 5.6 % 5.1  PTH, Intact Latest Ref Range: 15 - 65 pg/mL 45  Calcium, Total (PTH) Latest Ref Range: 8.7 - 10.2 mg/dL 9.0  PTH Interp Unknown Comment  TSH Latest Ref Range: 0.350 - 4.500 uIU/mL 1.262    PHYSICAL EXAM:   Gen: resting comfortably in bed, NAD  Ext:       Left Lower Extremity   Dressing/splint c/d/i  Ext warm   + DP pulse  EHL, FHL, lesser toe motor intact  DPN, SPN, TN sensation intact  Swelling stable   No pain with passive stretching    Assessment/Plan: 2 Days Post-Op   Principal Problem:   Closed fracture of left tibial plafond with fibula involvement Active Problems:   Morbid obesity (HCC)   Vitamin D deficiency   Anti-infectives (From admission, onward)   Start     Dose/Rate Route Frequency Ordered Stop   08/26/18 2200  ceFAZolin  (ANCEF) IVPB 2g/100 mL premix     2 g 200 mL/hr over 30 Minutes Intravenous Every 6 hours 08/26/18 2128 08/27/18 1115   08/26/18 0900  ceFAZolin (ANCEF) 3 g in dextrose 5 % 50 mL IVPB     3 g 100 mL/hr over 30 Minutes Intravenous On call to O.R. 08/26/18 0960 08/26/18 1520    .  POD/HD#: 2  57 y/o female s/p fall with L tibial plafond fracture and L fibula fracture   - fall   - closed L tibial plafond fracture and L fibula fracture s/p ORIF   NWB X 9 weeks  Splint x 2 weeks then allow for ROM   Ice and elevate  PT/OT     Body habitus will make it very difficult to mobilize     SNF  - Pain management:  Continue with current regimen   - ABL anemia/Hemodynamics  Stable  - Medical issues   Morbid obesity    - DVT/PE prophylaxis:  Weight-based prophylactic lovenox   - ID:   periop abx completed  - Metabolic Bone Disease:  Severe vitamin d deficiency    Likely chronic    Supplement vitamin d, vitamin c and calcium   - Activity:  NWB L leg   - FEN/GI prophylaxis/Foley/Lines:  Reg diet   protonix   NSL iv  - Impediments to fracture healing:  Morbid obesity   Vitamin d deficiency   Suspect some component of secondary osteoporosis  - Dispo:  PT/OT  SW for SNF   Hopeful for snf tomorrow     Mearl Latin, PA-C [phone] (C) 08/28/2018, 9:15 AM  [facility] [address] [place] [address] [phone] Collier Bullock (F)

## 2018-08-29 ENCOUNTER — Encounter (HOSPITAL_COMMUNITY): Payer: Self-pay | Admitting: Orthopedic Surgery

## 2018-08-29 DIAGNOSIS — E46 Unspecified protein-calorie malnutrition: Secondary | ICD-10-CM | POA: Diagnosis present

## 2018-08-29 DIAGNOSIS — F1190 Opioid use, unspecified, uncomplicated: Secondary | ICD-10-CM

## 2018-08-29 DIAGNOSIS — I10 Essential (primary) hypertension: Secondary | ICD-10-CM | POA: Diagnosis present

## 2018-08-29 HISTORY — DX: Opioid use, unspecified, uncomplicated: F11.90

## 2018-08-29 HISTORY — DX: Unspecified protein-calorie malnutrition: E46

## 2018-08-29 MED ORDER — DOCUSATE SODIUM 100 MG PO CAPS: 100.0000 mg | ORAL_CAPSULE | Freq: Two times a day (BID) | ORAL | 0 refills | Status: AC

## 2018-08-29 MED ORDER — VITAMIN D (ERGOCALCIFEROL) 1.25 MG (50000 UNIT) PO CAPS
50000.0000 [IU] | ORAL_CAPSULE | ORAL | Status: DC
  Administered 2018-08-29: 50000 [IU] via ORAL
  Filled 2018-08-29: qty 1

## 2018-08-29 MED ORDER — CALCIUM CITRATE 950 (200 CA) MG PO TABS: 200.0000 mg | ORAL_TABLET | Freq: Two times a day (BID) | ORAL | 0 refills | Status: AC

## 2018-08-29 MED ORDER — VITAMIN D (ERGOCALCIFEROL) 1.25 MG (50000 UNIT) PO CAPS: 50000.0000 [IU] | ORAL_CAPSULE | ORAL | 0 refills | Status: AC

## 2018-08-29 MED ORDER — MAGNESIUM OXIDE 400 (241.3 MG) MG PO TABS: 200.0000 mg | ORAL_TABLET | Freq: Every day | ORAL | 0 refills | Status: AC

## 2018-08-29 MED ORDER — HYDROCODONE-ACETAMINOPHEN 7.5-325 MG PO TABS: 1.0000 | ORAL_TABLET | Freq: Four times a day (QID) | ORAL | 0 refills | Status: DC | PRN

## 2018-08-29 MED ORDER — ASCORBIC ACID 500 MG PO TABS: 500.0000 mg | ORAL_TABLET | Freq: Every day | ORAL | 0 refills | Status: AC

## 2018-08-29 MED ORDER — VITAMIN C 500 MG PO TABS
500.0000 mg | ORAL_TABLET | Freq: Every day | ORAL | Status: DC
  Administered 2018-08-29 – 2018-09-01 (×4): 500 mg via ORAL
  Filled 2018-08-29 (×4): qty 1

## 2018-08-29 MED ORDER — CALCIUM CITRATE 950 (200 CA) MG PO TABS
200.0000 mg | ORAL_TABLET | Freq: Two times a day (BID) | ORAL | Status: DC
  Administered 2018-08-29 – 2018-09-01 (×7): 200 mg via ORAL
  Filled 2018-08-29 (×7): qty 1

## 2018-08-29 MED ORDER — ENOXAPARIN SODIUM 100 MG/ML ~~LOC~~ SOLN
0.5000 mg/kg | SUBCUTANEOUS | Status: DC
  Administered 2018-08-29 – 2018-08-31 (×3): 100 mg via SUBCUTANEOUS
  Filled 2018-08-29 (×3): qty 1

## 2018-08-29 MED ORDER — MAGNESIUM OXIDE 400 (241.3 MG) MG PO TABS
200.0000 mg | ORAL_TABLET | Freq: Two times a day (BID) | ORAL | Status: DC
  Administered 2018-08-29 – 2018-09-01 (×7): 200 mg via ORAL
  Filled 2018-08-29 (×7): qty 1

## 2018-08-29 MED ORDER — VITAMIN D3 25 MCG (1000 UNIT) PO TABS: 2000.0000 [IU] | ORAL_TABLET | Freq: Every day | ORAL | 0 refills | Status: AC

## 2018-08-29 MED ORDER — VITAMIN D 25 MCG (1000 UNIT) PO TABS
2000.0000 [IU] | ORAL_TABLET | Freq: Every day | ORAL | Status: DC
  Administered 2018-08-29 – 2018-09-01 (×4): 2000 [IU] via ORAL
  Filled 2018-08-29 (×4): qty 2

## 2018-08-29 MED ORDER — ENOXAPARIN SODIUM 100 MG/ML ~~LOC~~ SOLN: 0.5000 mg/kg | SUBCUTANEOUS | 0 refills | Status: AC

## 2018-08-29 MED ORDER — METOPROLOL TARTRATE 25 MG PO TABS
25.0000 mg | ORAL_TABLET | Freq: Every day | ORAL | Status: DC
  Administered 2018-08-29 – 2018-09-01 (×4): 25 mg via ORAL
  Filled 2018-08-29 (×4): qty 1

## 2018-08-29 NOTE — Progress Notes (Signed)
Nutrition Brief Note  RD consulted for assessment of nutritional status/ needs.   Wt Readings from Last 15 Encounters:  08/26/18 (!) 199.8 kg   [name] Spears is a 57 y.o. female who complained of left ankle pain that began about a week ago, it was thought to be a "sprain".  She then twisted it further on Saturday, felt a pop, was no longer capable of bearing weight.   Pt admitted with lt ankle fracture.   12/4- s/p Procedure(s) (LRB): OPEN REDUCTION INTERNAL FIXATION (ORIF) ANKLE FRACTURE (Left)  Case discussed with RN, who reports pt with no nutritional concerns. Pt has been eating and drinking well. Pt is preparing for transfer to SNF today.   Pt was working with physical therapy at time of visit. Stopped by later and pt was having discussion with SNF admissions coordinator.  If pt's weight loss is a concern, suspect pt will achieve slow, moderate wt loss at SNF due to diet restrictions and increased energy expenditure from participation in therapy.    Albumin has a half-life of 21 days and is strongly affected by stress response and inflammatory process, therefore, do not expect to see an improvement in this lab value during acute hospitalization. When a patient presents with low albumin, it is likely skewed due to the acute inflammatory response.  Unless it is suspected that patient had poor PO intake or malnutrition prior to admission, then RD should not be consulted solely for low albumin. Note that low albumin is no longer used to diagnose malnutrition; [place] uses the new malnutrition guidelines published by the American Society for Parenteral and Enteral Nutrition (A.S.P.E.N.) and the Academy of Nutrition and Dietetics (AND).    Labs reviewed: Mg: 1.4.  Body mass index is 71.1 kg/m. Patient meets criteria for extreme obesity, class III based on current BMI.   Current diet order is regular, patient is consuming approximately 75-100% of meals at this time. Labs and medications  reviewed.   No nutrition interventions warranted at this time. If nutrition issues arise, please consult RD.   [name] Spears, RD, LDN, CDE Pager: [phone] After hours Pager: [phone]

## 2018-08-29 NOTE — Progress Notes (Deleted)
Pt for discharge going home , discontinued foley cath, seen by OT/PT, given all her personal belongings, health teachings, next appointment, prescriptions, due med explained and understood, discontinued peripheral IV line, no complain of pain at this time.

## 2018-08-29 NOTE — Care Management Important Message (Signed)
Important Message  Patient Details  Name: [name] Spears MRN: [id number] Date of Birth: 1961/05/20   Medicare Important Message Given:  Yes    [name] Spears [name] Spears 08/29/2018, 9:23 AM

## 2018-08-29 NOTE — Progress Notes (Signed)
Physical Therapy Treatment Patient Details Name: [name] Spears MRN: [id number] DOB: [birth year] Today's Date: 08/29/2018    History of Present Illness Pt is a 57 y.o. morbidly obese female who presented with L ankle fx. She underwent ORIF 08/26/18.     PT Comments    Patient seen for mobility progression. Pt is motivated and participatory during therapy session. Pt is able to sit EOB with supervision/min guard with air completely out of air mattress for increased safety. Pt is at risk for sliding from EOB if mattress is inflated. Mattress inflated once pt in supine. Pt requires +3 assist for safe supine to sit/sit to supine and mod A +2 for rolling side to side. Maxislide removed prior to sitting EOB for increased safety.  Pt tolerated bilat LE strengthening exercises in both supine and in sitting and reports 4/10 pain in L LE while in dependent position. Pt encouraged to continue working on as many LE exercises as she is able, independently, throughout the day to maintain strength. Pt comfortable in bed with all needs met end of session. Maxi sky lift will need to be utilized for OOB transfers. Continue to progress as tolerated with anticipated d/c to SNF for further skilled PT services.    Follow Up Recommendations  SNF     Equipment Recommendations  Other (comment)(defer to next venue)    Recommendations for Other Services       Precautions / Restrictions Precautions Precautions: Fall Restrictions Weight Bearing Restrictions: Yes LLE Weight Bearing: Non weight bearing    Mobility  Bed Mobility Overal bed mobility: Needs Assistance Bed Mobility: Supine to Sit;Sit to Supine;Rolling Rolling: Mod assist;+2 for physical assistance   Supine to sit: Max assist;HOB elevated(+3) Sit to supine: Total assist(+3)   General bed mobility comments: maxislide removed and bed pads placed under pt; pt able to reach side to side for rails to assist and attempts to assist with R LE with assistance  first to position LE in flexion; pt assisted to bring hips and R LE to EOB with use of bed pad and then helicopter technique used to bring pt into sitting EOB; step by step cues for sequencing; assist with bilat LE and trunk to return to supine   Transfers                 General transfer comment: deferred for patient/therapist safety  Ambulation/Gait                 Stairs             Wheelchair Mobility    Modified Rankin (Stroke Patients Only)       Balance   Sitting-balance support: Feet supported Sitting balance-Leahy Scale: Fair Sitting balance - Comments: pt able to sit EOB with supervision/min guard for safety                                    Cognition Arousal/Alertness: Awake/alert Behavior During Therapy: WFL for tasks assessed/performed Overall Cognitive Status: Within Functional Limits for tasks assessed                                        Exercises Total Joint Exercises Knee Flexion: AROM;Right;Seated(with resistance) General Exercises - Lower Extremity Ankle Circles/Pumps: AROM;Right;Supine;Seated Quad Sets: AROM;Both;Strengthening;Supine Long Arc Quad: Strengthening;Both;Seated Heel Slides: AAROM;Both;Supine Hip ABduction/ADduction:  AAROM;Left;Supine Hip Flexion/Marching: Strengthening;Both;Seated    General Comments        Pertinent Vitals/Pain Pain Assessment: 0-10 Pain Score: 4  Pain Location: LLE in dependent position Pain Descriptors / Indicators: Aching;Throbbing Pain Intervention(s): Limited activity within patient's tolerance;Monitored during session;Repositioned    Home Living                      Prior Function            PT Goals (current goals can now be found in the care plan section) Progress towards PT goals: Progressing toward goals    Frequency    Min 3X/week      PT Plan Current plan remains appropriate    Co-evaluation              AM-PAC  PT "6 Clicks" Mobility   Outcome Measure  Help needed turning from your back to your side while in a flat bed without using bedrails?: Total Help needed moving from lying on your back to sitting on the side of a flat bed without using bedrails?: Total Help needed moving to and from a bed to a chair (including a wheelchair)?: Total Help needed standing up from a chair using your arms (e.g., wheelchair or bedside chair)?: Total Help needed to walk in hospital room?: Total Help needed climbing 3-5 steps with a railing? : Total 6 Click Score: 6    End of Session   Activity Tolerance: Patient tolerated treatment well Patient left: in bed;with call bell/phone within reach;with SCD's reapplied Nurse Communication: Mobility status;Need for lift equipment PT Visit Diagnosis: Other abnormalities of gait and mobility (R26.89);Pain Pain - Right/Left: Left Pain - part of body: Ankle and joints of foot     Time: 1050-1144 PT Time Calculation (min) (ACUTE ONLY): 54 min  Charges:  $Therapeutic Exercise: 23-37 mins $Therapeutic Activity: 23-37 mins                     [name] Spears, PTA Acute Rehabilitation Services Pager: [phone] Office: [phone]     [name] Spears 08/29/2018, 3:45 PM

## 2018-08-29 NOTE — Progress Notes (Signed)
Pt for discharge today going to nursing home, discontinue peripheral IV line, discontinued foley cath.

## 2018-08-29 NOTE — Plan of Care (Signed)
  Problem: Education: Goal: Knowledge of General Education information will improve Description Including pain rating scale, medication(s)/side effects and non-pharmacologic comfort measures Outcome: Progressing   Problem: Health Behavior/Discharge Planning: Goal: Ability to manage health-related needs will improve Outcome: Progressing   Problem: Clinical Measurements: Goal: Ability to maintain clinical measurements within normal limits will improve Outcome: Progressing   Problem: Clinical Measurements: Goal: Respiratory complications will improve Outcome: Progressing   Problem: Nutrition: Goal: Adequate nutrition will be maintained Outcome: Adequate for Discharge

## 2018-08-29 NOTE — Discharge Instructions (Signed)
Orthopaedic Trauma Service Discharge Instructions   General Discharge Instructions  WEIGHT BEARING STATUS: Nonweightbearing Left Leg   RANGE OF MOTION/ACTIVITY: do not remove splint. Ok to move toes. Pt needs to get up to chair frequently with assistance   Wound Care: do not get splint/dressing wet. Do not remove splint/dressing   DVT/PE prophylaxis: Lovenox x 14 days   Diet: as you were eating previously.  Can use over the counter stool softeners and bowel preparations, such as Miralax, to help with bowel movements.  Narcotics can be constipating.  Be sure to drink plenty of fluids  PAIN MEDICATION USE AND EXPECTATIONS  You have likely been given narcotic medications to help control your pain.  After a traumatic event that results in an fracture (broken bone) with or without surgery, it is ok to use narcotic pain medications to help control one's pain.  We understand that everyone responds to pain differently and each individual patient will be evaluated on a regular basis for the continued need for narcotic medications. Ideally, narcotic medication use should last no more than 6-8 weeks (coinciding with fracture healing).   As a patient it is your responsibility as well to monitor narcotic medication use and report the amount and frequency you use these medications when you come to your office visit.   We would also advise that if you are using narcotic medications, you should take a dose prior to therapy to maximize you participation.  IF YOU ARE ON NARCOTIC MEDICATIONS IT IS NOT PERMISSIBLE TO OPERATE A MOTOR VEHICLE (MOTORCYCLE/CAR/TRUCK/MOPED) OR HEAVY MACHINERY DO NOT MIX NARCOTICS WITH OTHER CNS (CENTRAL NERVOUS SYSTEM) DEPRESSANTS SUCH AS ALCOHOL   STOP SMOKING OR USING NICOTINE PRODUCTS!!!!  As discussed nicotine severely impairs your body's ability to heal surgical and traumatic wounds but also impairs bone healing.  Wounds and bone heal by forming microscopic blood vessels  (angiogenesis) and nicotine is a vasoconstrictor (essentially, shrinks blood vessels).  Therefore, if vasoconstriction occurs to these microscopic blood vessels they essentially disappear and are unable to deliver necessary nutrients to the healing tissue.  This is one modifiable factor that you can do to dramatically increase your chances of healing your injury.    (This means no smoking, no nicotine gum, patches, etc)  DO NOT USE NONSTEROIDAL ANTI-INFLAMMATORY DRUGS (NSAID'S)  Using products such as Advil (ibuprofen), Aleve (naproxen), Motrin (ibuprofen) for additional pain control during fracture healing can delay and/or prevent the healing response.  If you would like to take over the counter (OTC) medication, Tylenol (acetaminophen) is ok.  However, some narcotic medications that are given for pain control contain acetaminophen as well. Therefore, you should not exceed more than 4000 mg of tylenol in a day if you do not have liver disease.  Also note that there are may OTC medicines, such as cold medicines and allergy medicines that my contain tylenol as well.  If you have any questions about medications and/or interactions please ask your doctor/PA or your pharmacist.      ICE AND ELEVATE INJURED/OPERATIVE EXTREMITY  Using ice and elevating the injured extremity above your heart can help with swelling and pain control.  Icing in a pulsatile fashion, such as 20 minutes on and 20 minutes off, can be followed.    Do not place ice directly on skin. Make sure there is a barrier between to skin and the ice pack.    Using frozen items such as frozen peas works well as the conform nicely to the are  that needs to be iced.  USE AN ACE WRAP OR TED HOSE FOR SWELLING CONTROL  In addition to icing and elevation, Ace wraps or TED hose are used to help limit and resolve swelling.  It is recommended to use Ace wraps or TED hose until you are informed to stop.    When using Ace Wraps start the wrapping distally  (farthest away from the body) and wrap proximally (closer to the body)   Example: If you had surgery on your leg or thing and you do not have a splint on, start the ace wrap at the toes and work your way up to the thigh        If you had surgery on your upper extremity and do not have a splint on, start the ace wrap at your fingers and work your way up to the upper arm  IF YOU ARE IN A SPLINT OR CAST DO NOT REMOVE IT FOR ANY REASON   If your splint gets wet for any reason please contact the office immediately. You may shower in your splint or cast as long as you keep it dry.  This can be done by wrapping in a cast cover or garbage back (or similar)  Do Not stick any thing down your splint or cast such as pencils, money, or hangers to try and scratch yourself with.  If you feel itchy take benadryl as prescribed on the bottle for itching  IF YOU ARE IN A CAM BOOT (BLACK BOOT)  You may remove boot periodically. Perform daily dressing changes as noted below.  Wash the liner of the boot regularly and wear a sock when wearing the boot. It is recommended that you sleep in the boot until told otherwise  CALL THE OFFICE WITH ANY QUESTIONS OR CONCERNS: [phone]

## 2018-08-29 NOTE — Discharge Summary (Addendum)
Orthopaedic Trauma Service (OTS) Discharge Summary   Patient ID: [name] Spears MRN: [id number] DOB/AGE: Feb 22, 1961 57 y.o.  Admit date: 08/25/2018 Discharge date: 09/01/2018  Admission Diagnoses:    Closed fracture of left tibial plafond with fibula involvement   Morbid obesity (HCC)   Hypertension   Malnutrition (HCC)   Chronic, continuous use of opioids  Discharge Diagnoses:  Principal Problem:   Closed fracture of left tibial plafond with fibula involvement Active Problems:   Morbid obesity (HCC)   Vitamin D deficiency   Hypertension   Malnutrition (HCC)   Chronic, continuous use of opioids   Past Medical History:  Diagnosis Date  . Arthritis    "hips" (08/25/2018)  . Chronic, continuous use of opioids 08/29/2018  . Closed fracture of left tibial plafond with fibula involvement 08/25/2018  . Hypertension   . Malnutrition (HCC) 08/29/2018  . Migraine    "2-3/year" (08/25/2018)  . Morbid obesity (HCC) 08/28/2018  . Vitamin D deficiency 08/28/2018     Procedures Performed: 08/26/2018-Dr. Carola Frost 1.  Open reduction, internal fixation of left tibial plafond fracture. 2.  Second open reduction, internal fixation of left lateral malleolus tension stress fracture.   Discharged Condition: stable  Hospital Course:   Patient is a 57 year old morbidly obese white female who was transferred to [facility] hospital from [facility] on 08/25/2018 with a left bimalleolar ankle fracture.  Patient stated that she had some left ankle pain about a week ago at with a sprain but then sustained another fall on Saturday and felt a pop and was no longer able to put weight down her leg.  Once x-rays were obtained at [facility] she was found to have a left ankle fracture and due to her severe obesity transferred to [facility] for definitive treatment.  Patient had an isolated orthopedic injury and was admitted to the orthopedic service.  Please see consult and progress notes from the  orthopedic trauma service for full summary of details  Patient was taken to the operating room by the orthopedic trauma service on 08/26/2018 after the procedure noted above.  After surgery she was transferred back to the medical surgical floor for continued observation, pain control and therapies.  Due to her obesity and limited mobility as well as her inadequate care at home it was felt that she would need a skilled nursing facility at discharge for her safety.  Patient does live at home with her daughter who is also morbidly obese and the patient says that her daughter is larger than her and she is also essentially bedridden.  Daughter has a fianc who is a daughter's caretaker who does not work as well.  Again with all of these social issues at play we felt that a skilled nursing center would be in the patient's best interest.  Patient did not have any issues during her perioperative.  She did work with therapy during her hospital stay and again her mobility is limited due to her body habitus.  On 08/29/2018 patient was deemed stable to discharge to a skilled nursing center.   Even though the patient is obese I do feel that she has some component of malnutrition.  She has severe vitamin D deficiency which I suspect is chronic she did also demonstrate some evidence of magnesium deficiency on admission which was supplemented as well.  Albumin was also low which I do understand can be diminished in an inflammatory state however her prealbumin was also diminished as well.  I do feel that  given the totality of her findings this is consistent with poor nutritional habits and component of malnutrition.  Patient's vitamin D and calcium will be supplemented.  We will follow-up on her labs.  She is also on chronic opioid therapy which also in [place] her bone quality.  I would also recommend outpatient bone density scan within the next 8 to 12 weeks to see if patient needs to be on any additional pharmacologic treatment  for her bone health.  During her hospital stay patient was covered with Lovenox for routine perioperative antibiotic coverage.  She was also covered with Lovenox for DVT and PE prophylaxis.  We did have pharmacy consult given her body weight.  I would like for her to remain on Lovenox for the next 12weeks however she really is not that active at baseline and is mostly sedentary so I do not think that her risk for thromboembolic disease is increased by her most recent surgery.  Pt deemed stable for dc to SNF on 08/29/2018 but she remained in the hospital over the weekend awaiting insurance authorization. No acute events noted over the weekend. Remains stable and ready for dc on 09/01/2018  Consults: None  Significant Diagnostic Studies: labs:   Results for [name] Spears, [name] Spears (MRN [id number]) as of 08/29/2018 14:10  Ref. Range 08/27/2018 03:00  Sodium Latest Ref Range: 135 - 145 mmol/L 140  Potassium Latest Ref Range: 3.5 - 5.1 mmol/L 3.9  Chloride Latest Ref Range: 98 - 111 mmol/L 101  CO2 Latest Ref Range: 22 - 32 mmol/L 27  Glucose Latest Ref Range: 70 - 99 mg/dL 409 (H)  Mean Plasma Glucose Latest Units: mg/dL 81.19  BUN Latest Ref Range: 6 - 20 mg/dL 9  Creatinine Latest Ref Range: 0.44 - 1.00 mg/dL 1.47  Calcium Latest Ref Range: 8.9 - 10.3 mg/dL 9.0  Anion gap Latest Ref Range: 5 - 15  12  Calcium, Ionized, Serum Latest Ref Range: 4.5 - 5.6 mg/dL 4.9  Phosphorus Latest Ref Range: 2.5 - 4.6 mg/dL 3.8  Magnesium Latest Ref Range: 1.7 - 2.4 mg/dL 1.4 (L)  Alkaline Phosphatase Latest Ref Range: 38 - 126 U/L 54  Albumin Latest Ref Range: 3.5 - 5.0 g/dL 3.1 (L)  AST Latest Ref Range: 15 - 41 U/L 38  ALT Latest Ref Range: 0 - 44 U/L 24  Total Protein Latest Ref Range: 6.5 - 8.1 g/dL 6.2 (L)  Total Bilirubin Latest Ref Range: 0.3 - 1.2 mg/dL 0.7  PREALBUMIN Latest Ref Range: 18 - 38 mg/dL 82.9 (L)  GFR, Est Non African American Latest Ref Range: >60 mL/min >60  GFR, Est African American Latest Ref  Range: >60 mL/min >60  Vit D, 1,25-Dihydroxy Latest Ref Range: 19.9 - 79.3 pg/mL 46.5  Vitamin D, 25-Hydroxy Latest Ref Range: 30.0 - 100.0 ng/mL 5.2 (L)  WBC Latest Ref Range: 4.0 - 10.5 K/uL 9.0  RBC Latest Ref Range: 3.87 - 5.11 MIL/uL 3.82 (L)  Hemoglobin Latest Ref Range: 12.0 - 15.0 g/dL 56.2  HCT Latest Ref Range: 36.0 - 46.0 % 39.9  MCV Latest Ref Range: 80.0 - 100.0 fL 104.5 (H)  MCH Latest Ref Range: 26.0 - 34.0 pg 32.2  MCHC Latest Ref Range: 30.0 - 36.0 g/dL 13.0  RDW Latest Ref Range: 11.5 - 15.5 % 13.3  Platelets Latest Ref Range: 150 - 400 K/uL 174  nRBC Latest Ref Range: 0.0 - 0.2 % 0.0  Hemoglobin A1C Latest Ref Range: 4.8 - 5.6 % 5.1  PTH, Intact Latest  Ref Range: 15 - 65 pg/mL 45  Calcium, Total (PTH) Latest Ref Range: 8.7 - 10.2 mg/dL 9.0  PTH Interp Unknown Comment  TSH Latest Ref Range: 0.350 - 4.500 uIU/mL 1.262    Treatments: IV hydration, antibiotics: Ancef, analgesia: Norco, Dilaudid, Robaxin, anticoagulation: LMW heparin, therapies: PT, OT, RN and SW and surgery: As above  Discharge Exam:  Orthopedic Trauma Service Progress Note  Orthopedic Progress Note   Patient ID: [name] Spears MRN: [id number] DOB/AGE: [birth year] 57 y.o.   Subjective: No changes.  Still waiting on insurance approval for SNF placement.   Doing well.   Pain controlled.  Tolerating diet and voiding.   ROS As above   Objective:    VITALS:         Vitals:    08/30/18 0600 08/30/18 1325 08/30/18 2003 08/31/18 0543  BP: (!) 144/82 (!) 141/77 (!) 154/73 (!) 143/64  Pulse:   72 85 90  Resp:   20 19 19   Temp:   98.6 F (37 C) 99.2 F (37.3 C) 97.7 F (36.5 C)  TempSrc:   Oral Oral Oral  SpO2:   93% 100% 98%  Weight:          Height:              Estimated body mass index is 71.1 kg/m as calculated from the following:   Height as of this encounter: 5\' 6"  (1.676 m).   Weight as of this encounter: 199.8 kg.     Intake/Output      12/07 0701 - 12/08 0700 12/08 0701 -  12/09 0700   P.O. 600    Total Intake(mL/kg) 600 (3)    Urine (mL/kg/hr) 3775 (0.8)    Stool 0    Total Output 3775    Net -3175         Stool Occurrence 1 x       LABS   Lab Results Last 24 Hours  No results found for this or any previous visit (from the past 24 hour(s)).       PHYSICAL EXAM:  General: Supine in bed on arrival.  Calm, conversant Cardiac: Regular rate Lungs: No increased work of breathing MSK:             LLE: Splint in place and in good condition.  Foot/toes warm.  EHL and FHL intact.  Sensation intact distally.  No pain with active or passive motion.       Assessment/Plan: 5 Days Post-Op    Principal Problem:   Closed fracture of left tibial plafond with fibula involvement Active Problems:   Morbid obesity (HCC)   Vitamin D deficiency   Hypertension   Malnutrition (HCC)   Chronic, continuous use of opioids                Anti-infectives (From admission, onward)    Start     Dose/Rate Route Frequency Ordered Stop    08/26/18 2200   ceFAZolin (ANCEF) IVPB 2g/100 mL premix     2 g 200 mL/hr over 30 Minutes Intravenous Every 6 hours 08/26/18 2128 08/27/18 1115    08/26/18 0900   ceFAZolin (ANCEF) 3 g in dextrose 5 % 50 mL IVPB     3 g 100 mL/hr over 30 Minutes Intravenous On call to O.R. 08/26/18 4098 08/26/18 1520     .   POD/HD#: 20   57 y/o female s/p fall with L tibial plafond fracture and L fibula fracture    -  fall    - Closed L tibial plafond fracture and L fibula fracture s/p ORIF              Plan per OTS             NWB X 9 weeks             Splint x 2 weeks then allow for ROM              Ice and elevate             PT/OT                Body habitus will make it very difficult to mobilize                SNF   - Pain management:             Continue with current regimen    - ABL anemia/Hemodynamics             Stable   - Medical issues              Morbid obesity              HTN: home meds                           Metoprolol restarted 08/29/2018               Malnutrition                          Likely in setting of morbid obesity                             - DVT/PE prophylaxis:             Weight-based prophylactic lovenox    - ID:              periop abx completed   - Metabolic Bone Disease:             Severe vitamin d deficiency                          Likely chronic                          Supplement vitamin d, vitamin c and calcium                Chronic opioid use               - Activity:             NWB L leg    - FEN/GI prophylaxis/Foley/Lines:             Reg diet              protonix              NSL iv   - Impediments to fracture healing:             Morbid obesity              Chronic opioid use              Vitamin d deficiency  Suspect some component of secondary osteoporosis                         Supplement calcium and vitamin D                Recommend DEXA as an outpt                          Clinically pts bone is very soft!!!   - Dispo:             PT/OT             Awaiting SNF approval     Weightbearing: NWB LLE Insicional and dressing care: Dressings left intact until follow-up Orthopedic device(s): Splint Showering: ok to bathe, do not get splint/dressing wet  VTE prophylaxis: lovenox weightbased x 14 days  .  Pain control: norco, tylenol, robaxin Follow - up plan: 2 weeks Contact information:  Inda Merlin MD, Montez Morita  PA    Disposition: Discharge disposition: 03-Skilled Nursing Facility       Discharge Instructions    Call MD / Call 911   Complete by:  As directed    If you experience chest pain or shortness of breath, CALL 911 and be transported to the hospital emergency room.  If you develope a fever above 101 F, pus (white drainage) or increased drainage or redness at the wound, or calf pain, call your surgeon's office.   Constipation Prevention   Complete by:  As directed    Drink plenty of fluids.   Prune juice may be helpful.  You may use a stool softener, such as Colace (over the counter) 100 mg twice a day.  Use MiraLax (over the counter) for constipation as needed.   Diet general   Complete by:  As directed    Discharge instructions   Complete by:  As directed    Orthopaedic Trauma Service Discharge Instructions   General Discharge Instructions  WEIGHT BEARING STATUS: Nonweightbearing Left Leg   RANGE OF MOTION/ACTIVITY: do not remove splint. Ok to move toes. Pt needs to get up to chair frequently with assistance   Wound Care: do not get splint/dressing wet. Do not remove splint/dressing   DVT/PE prophylaxis: Lovenox x 14 days   Diet: as you were eating previously.  Can use over the counter stool softeners and bowel preparations, such as Miralax, to help with bowel movements.  Narcotics can be constipating.  Be sure to drink plenty of fluids  PAIN MEDICATION USE AND EXPECTATIONS  You have likely been given narcotic medications to help control your pain.  After a traumatic event that results in an fracture (broken bone) with or without surgery, it is ok to use narcotic pain medications to help control one's pain.  We understand that everyone responds to pain differently and each individual patient will be evaluated on a regular basis for the continued need for narcotic medications. Ideally, narcotic medication use should last no more than 6-8 weeks (coinciding with fracture healing).   As a patient it is your responsibility as well to monitor narcotic medication use and report the amount and frequency you use these medications when you come to your office visit.   We would also advise that if you are using narcotic medications, you should take a dose prior to therapy to maximize you participation.  IF YOU ARE ON NARCOTIC MEDICATIONS IT IS NOT PERMISSIBLE TO OPERATE  A MOTOR VEHICLE (MOTORCYCLE/CAR/TRUCK/MOPED) OR HEAVY MACHINERY DO NOT MIX NARCOTICS WITH OTHER CNS (CENTRAL NERVOUS  SYSTEM) DEPRESSANTS SUCH AS ALCOHOL   STOP SMOKING OR USING NICOTINE PRODUCTS!!!!  As discussed nicotine severely impairs your body's ability to heal surgical and traumatic wounds but also impairs bone healing.  Wounds and bone heal by forming microscopic blood vessels (angiogenesis) and nicotine is a vasoconstrictor (essentially, shrinks blood vessels).  Therefore, if vasoconstriction occurs to these microscopic blood vessels they essentially disappear and are unable to deliver necessary nutrients to the healing tissue.  This is one modifiable factor that you can do to dramatically increase your chances of healing your injury.    (This means no smoking, no nicotine gum, patches, etc)  DO NOT USE NONSTEROIDAL ANTI-INFLAMMATORY DRUGS (NSAID'S)  Using products such as Advil (ibuprofen), Aleve (naproxen), Motrin (ibuprofen) for additional pain control during fracture healing can delay and/or prevent the healing response.  If you would like to take over the counter (OTC) medication, Tylenol (acetaminophen) is ok.  However, some narcotic medications that are given for pain control contain acetaminophen as well. Therefore, you should not exceed more than 4000 mg of tylenol in a day if you do not have liver disease.  Also note that there are may OTC medicines, such as cold medicines and allergy medicines that my contain tylenol as well.  If you have any questions about medications and/or interactions please ask your doctor/PA or your pharmacist.      ICE AND ELEVATE INJURED/OPERATIVE EXTREMITY  Using ice and elevating the injured extremity above your heart can help with swelling and pain control.  Icing in a pulsatile fashion, such as 20 minutes on and 20 minutes off, can be followed.    Do not place ice directly on skin. Make sure there is a barrier between to skin and the ice pack.    Using frozen items such as frozen peas works well as the conform nicely to the are that needs to be iced.  USE AN ACE WRAP  OR TED HOSE FOR SWELLING CONTROL  In addition to icing and elevation, Ace wraps or TED hose are used to help limit and resolve swelling.  It is recommended to use Ace wraps or TED hose until you are informed to stop.    When using Ace Wraps start the wrapping distally (farthest away from the body) and wrap proximally (closer to the body)   Example: If you had surgery on your leg or thing and you do not have a splint on, start the ace wrap at the toes and work your way up to the thigh        If you had surgery on your upper extremity and do not have a splint on, start the ace wrap at your fingers and work your way up to the upper arm  IF YOU ARE IN A SPLINT OR CAST DO NOT REMOVE IT FOR ANY REASON   If your splint gets wet for any reason please contact the office immediately. You may shower in your splint or cast as long as you keep it dry.  This can be done by wrapping in a cast cover or garbage back (or similar)  Do Not stick any thing down your splint or cast such as pencils, money, or hangers to try and scratch yourself with.  If you feel itchy take benadryl as prescribed on the bottle for itching  IF YOU ARE IN A CAM BOOT (BLACK BOOT)  You may remove  boot periodically. Perform daily dressing changes as noted below.  Wash the liner of the boot regularly and wear a sock when wearing the boot. It is recommended that you sleep in the boot until told otherwise  CALL THE OFFICE WITH ANY QUESTIONS OR CONCERNS: [phone]   Increase activity slowly as tolerated   Complete by:  As directed    Non weight bearing   Complete by:  As directed    Laterality:  left   Extremity:  Lower     Allergies as of 09/01/2018   No Known Allergies     Medication List    STOP taking these medications   GOODY HEADACHE PO   traMADol 50 MG tablet Commonly known as:  ULTRAM     TAKE these medications   ascorbic acid 500 MG tablet Commonly known as:  VITAMIN C Take 1 tablet (500 mg total) by mouth daily.     calcium citrate 950 MG tablet Commonly known as:  CALCITRATE - dosed in mg elemental calcium Take 1 tablet (200 mg of elemental calcium total) by mouth 2 (two) times daily.   cholecalciferol 25 MCG (1000 UT) tablet Commonly known as:  VITAMIN D Take 2 tablets (2,000 Units total) by mouth daily.   docusate sodium 100 MG capsule Commonly known as:  COLACE Take 1 capsule (100 mg total) by mouth 2 (two) times daily.   enoxaparin 100 MG/ML injection Commonly known as:  LOVENOX Inject 1 mL (100 mg total) into the skin daily for 14 days.   HYDROcodone-acetaminophen 7.5-325 MG tablet Commonly known as:  NORCO Take 1-2 tablets by mouth every 6 (six) hours as needed for severe pain.   magnesium oxide 400 (241.3 Mg) MG tablet Commonly known as:  MAG-OX Take 0.5 tablets (200 mg total) by mouth daily.   metoprolol tartrate 25 MG tablet Commonly known as:  LOPRESSOR Take 25 mg by mouth daily.   Vitamin D (Ergocalciferol) 1.25 MG (50000 UT) Caps capsule Commonly known as:  DRISDOL Take 1 capsule (50,000 Units total) by mouth every 7 (seven) days.            Discharge Care Instructions  (From admission, onward)         Start     Ordered   08/29/18 0000  Non weight bearing    Question Answer Comment  Laterality left   Extremity Lower      08/29/18 1009          Contact information for follow-up providers    Myrene Galas, MD. Schedule an appointment as soon as possible for a visit in 3 week(s).   Specialty:  Orthopedic Surgery Contact information: [address] [place] [address] [phone]            Contact information for after-discharge care    Destination    [facility] .   Service:  Skilled Nursing Contact information: [address] [place] [address] [phone]                  Discharge Instructions and Plan:   57 y/o female s/p fall with L tibial plafond fracture and L fibula fracture     - fall    - closed L tibial plafond fracture and L fibula fracture s/p ORIF              NWB X 9 weeks             Splint x 2 weeks  then allow for ROM              Ice and elevate             PT/OT                Body habitus will make it very difficult to mobilize                SNF   - Pain management:             Norco as needed  Hold on patient's chronic Ultram therapy for now   - Medical issues              Morbid obesity              HTN: home meds                          Restart metoprolol today as BPs running a little high                Malnutrition                          Likely in setting of morbid obesity                             - DVT/PE prophylaxis:             Weight-based prophylactic lovenox x 14 days      - Metabolic Bone Disease:             Severe vitamin d deficiency                          Likely chronic                          Supplement vitamin d, vitamin c and calcium                Chronic opioid use               - Activity:             NWB L leg    - FEN/GI prophylaxis/Foley/Lines:             Reg diet   - Impediments to fracture healing:             Morbid obesity              Chronic opioid use              Vitamin d deficiency                         Suspect some component of secondary osteoporosis                         Supplement calcium and vitamin D                Recommend DEXA as an outpt                          Clinically pts bone is very soft!!!   - Dispo:  PT/OT             stable for SNF when bed available      Weightbearing: NWB LLE Insicional and dressing care: Dressings left intact until follow-up Orthopedic device(s): Splint Showering: ok to bathe, do not get splint/dressing wet  VTE prophylaxis: lovenox weightbased x 14 days  .  Pain control: norco, tylenol, robaxin Follow - up plan: 2 weeks Contact information:  Inda Merlin MD, Montez Morita  PA  Signed:  Mearl Latin,  PA-C [phone] (C) 09/01/2018, 1:15 PM  [facility] [address] [place] [address] [phone] Collier Bullock (F)

## 2018-08-29 NOTE — Progress Notes (Addendum)
Orthopedic Trauma Service Progress Note  Patient ID: [name] Spears MRN: [id number] DOB/AGE: 05/02/1961 57 y.o.  Subjective:  Doing ok  No specific complaints  Is concerned that her ankle will break again after it is healed  Awaiting insurance auth for SNF  + vitamin D deficiency + magnesium deficiency    ROS As above  Objective:   VITALS:   Vitals:   08/28/18 0457 08/28/18 1516 08/28/18 2028 08/29/18 0439  BP: 129/73 (!) 145/72 (!) 145/88 (!) 152/82  Pulse: 96 85 94 81  Resp: 16 18 19 17   Temp: 98.7 F (37.1 C) 99 F (37.2 C) 98.9 F (37.2 C) 97.8 F (36.6 C)  TempSrc: Oral Oral Oral Oral  SpO2: 96% 95% 96% 97%  Weight:      Height:        Estimated body mass index is 71.1 kg/m as calculated from the following:   Height as of this encounter: 5\' 6"  (1.676 m).   Weight as of this encounter: 199.8 kg.   Intake/Output      12/05 0701 - 12/06 0700 12/06 0701 - 12/07 0700   P.O. 2100    I.V. (mL/kg)     Total Intake(mL/kg) 2100 (10.5)    Urine (mL/kg/hr) 4225 (0.9)    Stool 0    Total Output 4225    Net -2125         Stool Occurrence 1 x      LABS  No results found for this or any previous visit (from the past 24 hour(s)).   PHYSICAL EXAM:   Gen: resting comfortably in bed, NAD  Cardiac: regular  Lungs: breathing unlabored  Ext:       Left Lower Extremity              Dressing/splint c/d/i             Ext warm              + DP pulse             EHL, FHL, lesser toe motor intact             DPN, SPN, TN sensation intact             Swelling stable              No pain with passive stretching    Assessment/Plan: 3 Days Post-Op   Principal Problem:   Closed fracture of left tibial plafond with fibula involvement Active Problems:   Morbid obesity (HCC)   Vitamin D deficiency   Hypertension   Anti-infectives (From admission, onward)   Start     Dose/Rate Route  Frequency Ordered Stop   08/26/18 2200  ceFAZolin (ANCEF) IVPB 2g/100 mL premix     2 g 200 mL/hr over 30 Minutes Intravenous Every 6 hours 08/26/18 2128 08/27/18 1115   08/26/18 0900  ceFAZolin (ANCEF) 3 g in dextrose 5 % 50 mL IVPB     3 g 100 mL/hr over 30 Minutes Intravenous On call to O.R. 08/26/18 4098 08/26/18 1520    .  POD/HD#: 67  57 y/o female s/p fall with L tibial plafond fracture and L fibula fracture    - fall    - closed L tibial plafond fracture and L fibula fracture  s/p ORIF              NWB X 9 weeks             Splint x 2 weeks then allow for ROM              Ice and elevate             PT/OT                           Body habitus will make it very difficult to mobilize                           SNF   - Pain management:             Continue with current regimen    - ABL anemia/Hemodynamics             Stable   - Medical issues              Morbid obesity   HTN: home meds    Restart metoprolol today as BPs running a little high    Malnutrition    Likely in setting of morbid obesity                  - DVT/PE prophylaxis:             Weight-based prophylactic lovenox    - ID:              periop abx completed   - Metabolic Bone Disease:             Severe vitamin d deficiency                          Likely chronic                          Supplement vitamin d, vitamin c and calcium    Chronic opioid use    - Activity:             NWB L leg    - FEN/GI prophylaxis/Foley/Lines:             Reg diet              protonix              NSL iv   - Impediments to fracture healing:             Morbid obesity   Chronic opioid use              Vitamin d deficiency                         Suspect some component of secondary osteoporosis   Supplement calcium and vitamin D    Recommend DEXA as an outpt    Clinically pts bone is very soft!!!   - Dispo:             PT/OT             stable for SNF when bed available    Weightbearing: NWB  LLE Insicional and dressing care: Dressings left intact until follow-up Orthopedic device(s): Splint Showering: ok to bathe, do not get splint/dressing wet  VTE prophylaxis: lovenox weightbased x 14 days  .  Pain control: norco, tylenol, robaxin Follow - up plan: 2 weeks Contact information:  Inda Merlin MD, Montez Morita  PA   Mearl Latin, PA-C [phone] (C) 08/29/2018, 9:34 AM  [facility] [address] [place] [address] [phone] Collier Bullock (F)

## 2018-08-30 LAB — MAGNESIUM: Magnesium: 1.4 mg/dL — ABNORMAL LOW (ref 1.7–2.4)

## 2018-08-30 NOTE — Progress Notes (Addendum)
Orthopedic Progress Note  Patient ID: [name] Spears MRN: [id number] DOB/AGE: 1960-12-01 57 y.o.  Subjective: No significant changes overnight. Doing well.   Pain controlled.  Tolerating diet and voiding. Awaiting SNF placement.   ROS As above  Objective:   VITALS:   Vitals:   08/29/18 1311 08/29/18 2157 08/30/18 0541 08/30/18 0600  BP: (!) 152/79 (!) 157/75 (!) 177/81 (!) 144/82  Pulse: 70 80 80   Resp:  (!) 21 (!) 21   Temp: 97.7 F (36.5 C) 98.1 F (36.7 C) 98.4 F (36.9 C)   TempSrc: Oral Oral Oral   SpO2: 100% 96% 100%   Weight:      Height:        Estimated body mass index is 71.1 kg/m as calculated from the following:   Height as of this encounter: 5\' 6"  (1.676 m).   Weight as of this encounter: 199.8 kg.   Intake/Output      12/06 0701 - 12/07 0700 12/07 0701 - 12/08 0700   P.O. 480    Total Intake(mL/kg) 480 (2.4)    Urine (mL/kg/hr) 750 (0.2)    Stool 0    Total Output 750    Net -270         Urine Occurrence 1 x    Stool Occurrence 1 x      LABS  Results for orders placed or performed during the hospital encounter of 08/25/18 (from the past 24 hour(s))  Magnesium     Status: Abnormal   Collection Time: 08/30/18  4:14 AM  Result Value Ref Range   Magnesium 1.4 (L) 1.7 - 2.4 mg/dL     PHYSICAL EXAM:  General: Supine in bed on arrival.  Calm, conversant Cardiac: Regular rate Lungs: No increased work of breathing MSK:  LLE: Splint in place and in good condition.  Foot/toes warm.  EHL and FHL intact.  Sensation intact distally.  No pain with active or passive motion.     Assessment/Plan: 4 Days Post-Op   Principal Problem:   Closed fracture of left tibial plafond with fibula involvement Active Problems:   Morbid obesity (HCC)   Vitamin D deficiency   Hypertension   Malnutrition (HCC)   Chronic, continuous use of opioids   Anti-infectives (From admission, onward)   Start     Dose/Rate Route Frequency Ordered Stop   08/26/18 2200   ceFAZolin (ANCEF) IVPB 2g/100 mL premix     2 g 200 mL/hr over 30 Minutes Intravenous Every 6 hours 08/26/18 2128 08/27/18 1115   08/26/18 0900  ceFAZolin (ANCEF) 3 g in dextrose 5 % 50 mL IVPB     3 g 100 mL/hr over 30 Minutes Intravenous On call to O.R. 08/26/18 [id number] 08/26/18 1520    .  POD/HD#: 424  57 y/o female s/p fall with L tibial plafond fracture and L fibula fracture    - fall    - Closed L tibial plafond fracture and L fibula fracture s/p ORIF              Plan per OTS  NWB X 9 weeks             Splint x 2 weeks then allow for ROM              Ice and elevate             PT/OT  Body habitus will make it very difficult to mobilize                           SNF   - Pain management:             Continue with current regimen    - ABL anemia/Hemodynamics             Stable   - Medical issues              Morbid obesity   HTN: home meds    Metoprolol restarted 08/29/2018   Malnutrition    Likely in setting of morbid obesity                  - DVT/PE prophylaxis:             Weight-based prophylactic lovenox    - ID:              periop abx completed   - Metabolic Bone Disease:             Severe vitamin d deficiency                          Likely chronic                          Supplement vitamin d, vitamin c and calcium    Chronic opioid use    - Activity:             NWB L leg    - FEN/GI prophylaxis/Foley/Lines:             Reg diet              protonix              NSL iv   - Impediments to fracture healing:             Morbid obesity   Chronic opioid use              Vitamin d deficiency                         Suspect some component of secondary osteoporosis   Supplement calcium and vitamin D    Recommend DEXA as an outpt    Clinically pts bone is very soft!!!   - Dispo:             PT/OT             Awaiting SNF approval   Weightbearing: NWB LLE Insicional and dressing care: Dressings left intact until  follow-up Orthopedic device(s): Splint Showering: ok to bathe, do not get splint/dressing wet  VTE prophylaxis: lovenox weightbased x 14 days  .  Pain control: norco, tylenol, robaxin Follow - up plan: 2 weeks Contact information:  Inda Merlin MD, Montez Morita  PA Aquilla Hacker, PA-C and Wandra Feinstein, MD over weekend   Albina Billet III, PA-C 08/30/2018 8:06 AM

## 2018-08-31 NOTE — Progress Notes (Signed)
Orthopedic Progress Note  Patient ID: [name] SaxDonna Harshberger MRN: [id number] DOB/AGE: 06-16-1961 57 y.o.  Subjective: No changes.  Still waiting on insurance approval for SNF placement.  Doing well.   Pain controlled.  Tolerating diet and voiding.  ROS As above  Objective:   VITALS:   Vitals:   08/30/18 0600 08/30/18 1325 08/30/18 2003 08/31/18 0543  BP: (!) 144/82 (!) 141/77 (!) 154/73 (!) 143/64  Pulse:  72 85 90  Resp:  20 19 19   Temp:  98.6 F (37 C) 99.2 F (37.3 C) 97.7 F (36.5 C)  TempSrc:  Oral Oral Oral  SpO2:  93% 100% 98%  Weight:      Height:        Estimated body mass index is 71.1 kg/m as calculated from the following:   Height as of this encounter: 5\' 6"  (1.676 m).   Weight as of this encounter: 199.8 kg.   Intake/Output      12/07 0701 - 12/08 0700 12/08 0701 - 12/09 0700   P.O. 600    Total Intake(mL/kg) 600 (3)    Urine (mL/kg/hr) 3775 (0.8)    Stool 0    Total Output 3775    Net -3175         Stool Occurrence 1 x      LABS  No results found for this or any previous visit (from the past 24 hour(s)).   PHYSICAL EXAM:  General: Supine in bed on arrival.  Calm, conversant Cardiac: Regular rate Lungs: No increased work of breathing MSK:  LLE: Splint in place and in good condition.  Foot/toes warm.  EHL and FHL intact.  Sensation intact distally.  No pain with active or passive motion.     Assessment/Plan: 5 Days Post-Op   Principal Problem:   Closed fracture of left tibial plafond with fibula involvement Active Problems:   Morbid obesity (HCC)   Vitamin D deficiency   Hypertension   Malnutrition (HCC)   Chronic, continuous use of opioids   Anti-infectives (From admission, onward)   Start     Dose/Rate Route Frequency Ordered Stop   08/26/18 2200  ceFAZolin (ANCEF) IVPB 2g/100 mL premix     2 g 200 mL/hr over 30 Minutes Intravenous Every 6 hours 08/26/18 2128 08/27/18 1115   08/26/18 0900  ceFAZolin (ANCEF) 3 g in dextrose 5 % 50 mL  IVPB     3 g 100 mL/hr over 30 Minutes Intravenous On call to O.R. 08/26/18 [id number] 08/26/18 1520    .  POD/HD#: 175  57 y/o female s/p fall with L tibial plafond fracture and L fibula fracture    - fall    - Closed L tibial plafond fracture and L fibula fracture s/p ORIF              Plan per OTS  NWB X 9 weeks             Splint x 2 weeks then allow for ROM              Ice and elevate             PT/OT                           Body habitus will make it very difficult to mobilize  SNF   - Pain management:             Continue with current regimen    - ABL anemia/Hemodynamics             Stable   - Medical issues              Morbid obesity   HTN: home meds    Metoprolol restarted 08/29/2018   Malnutrition    Likely in setting of morbid obesity                  - DVT/PE prophylaxis:             Weight-based prophylactic lovenox    - ID:              periop abx completed   - Metabolic Bone Disease:             Severe vitamin d deficiency                          Likely chronic                          Supplement vitamin d, vitamin c and calcium    Chronic opioid use    - Activity:             NWB L leg    - FEN/GI prophylaxis/Foley/Lines:             Reg diet              protonix              NSL iv   - Impediments to fracture healing:             Morbid obesity   Chronic opioid use              Vitamin d deficiency                         Suspect some component of secondary osteoporosis   Supplement calcium and vitamin D    Recommend DEXA as an outpt    Clinically pts bone is very soft!!!   - Dispo:             PT/OT             Awaiting SNF approval   Weightbearing: NWB LLE Insicional and dressing care: Dressings left intact until follow-up Orthopedic device(s): Splint Showering: ok to bathe, do not get splint/dressing wet  VTE prophylaxis: lovenox weightbased x 14 days  .  Pain control: norco, tylenol,  robaxin Follow - up plan: 2 weeks Contact information:  Inda Merlin MD, Montez Morita  PA Aquilla Hacker, PA-C and Wandra Feinstein, MD over weekend   Albina Billet III, PA-C 08/31/2018 7:22 AM

## 2018-09-01 DIAGNOSIS — E785 Hyperlipidemia, unspecified: Secondary | ICD-10-CM | POA: Diagnosis not present

## 2018-09-01 DIAGNOSIS — M80862D Other osteoporosis with current pathological fracture, left lower leg, subsequent encounter for fracture with routine healing: Secondary | ICD-10-CM | POA: Diagnosis not present

## 2018-09-01 DIAGNOSIS — Z7401 Bed confinement status: Secondary | ICD-10-CM | POA: Diagnosis not present

## 2018-09-01 DIAGNOSIS — R262 Difficulty in walking, not elsewhere classified: Secondary | ICD-10-CM | POA: Diagnosis not present

## 2018-09-01 DIAGNOSIS — I10 Essential (primary) hypertension: Secondary | ICD-10-CM | POA: Diagnosis not present

## 2018-09-01 DIAGNOSIS — R5381 Other malaise: Secondary | ICD-10-CM | POA: Diagnosis not present

## 2018-09-01 DIAGNOSIS — S82876D Nondisplaced pilon fracture of unspecified tibia, subsequent encounter for closed fracture with routine healing: Secondary | ICD-10-CM | POA: Diagnosis not present

## 2018-09-01 DIAGNOSIS — R002 Palpitations: Secondary | ICD-10-CM | POA: Diagnosis not present

## 2018-09-01 DIAGNOSIS — G43009 Migraine without aura, not intractable, without status migrainosus: Secondary | ICD-10-CM | POA: Diagnosis not present

## 2018-09-01 DIAGNOSIS — R0902 Hypoxemia: Secondary | ICD-10-CM | POA: Diagnosis not present

## 2018-09-01 DIAGNOSIS — M6281 Muscle weakness (generalized): Secondary | ICD-10-CM | POA: Diagnosis not present

## 2018-09-01 DIAGNOSIS — R52 Pain, unspecified: Secondary | ICD-10-CM | POA: Diagnosis not present

## 2018-09-01 DIAGNOSIS — M80862A Other osteoporosis with current pathological fracture, left lower leg, initial encounter for fracture: Secondary | ICD-10-CM | POA: Diagnosis not present

## 2018-09-01 DIAGNOSIS — M2550 Pain in unspecified joint: Secondary | ICD-10-CM | POA: Diagnosis not present

## 2018-09-01 DIAGNOSIS — R110 Nausea: Secondary | ICD-10-CM | POA: Diagnosis not present

## 2018-09-01 DIAGNOSIS — S82872D Displaced pilon fracture of left tibia, subsequent encounter for closed fracture with routine healing: Secondary | ICD-10-CM | POA: Diagnosis not present

## 2018-09-01 DIAGNOSIS — M1990 Unspecified osteoarthritis, unspecified site: Secondary | ICD-10-CM | POA: Diagnosis not present

## 2018-09-01 DIAGNOSIS — Z9049 Acquired absence of other specified parts of digestive tract: Secondary | ICD-10-CM | POA: Diagnosis not present

## 2018-09-01 DIAGNOSIS — R29898 Other symptoms and signs involving the musculoskeletal system: Secondary | ICD-10-CM | POA: Diagnosis not present

## 2018-09-01 DIAGNOSIS — E559 Vitamin D deficiency, unspecified: Secondary | ICD-10-CM | POA: Diagnosis not present

## 2018-09-01 DIAGNOSIS — S82892A Other fracture of left lower leg, initial encounter for closed fracture: Secondary | ICD-10-CM | POA: Diagnosis not present

## 2018-09-01 DIAGNOSIS — B372 Candidiasis of skin and nail: Secondary | ICD-10-CM | POA: Diagnosis not present

## 2018-09-01 DIAGNOSIS — R9431 Abnormal electrocardiogram [ECG] [EKG]: Secondary | ICD-10-CM | POA: Diagnosis not present

## 2018-09-01 DIAGNOSIS — W19XXXA Unspecified fall, initial encounter: Secondary | ICD-10-CM | POA: Diagnosis not present

## 2018-09-01 NOTE — Progress Notes (Signed)
Patient will DC to: [facility] Anticipated DC date: 09/01/18 Family notified: Chanetta MarshallJimmy and Efraim KaufmannMelissa Transport by: Sharin MonsPTAR  Per MD patient ready for DC to  [facility]. RN, patient, patient's family, and facility notified of DC. Discharge Summary sent to facility. RN given number for report. [phone]. DC packet on chart. Ambulance transport requested for patient.  CSW signing off.  [place] Plains[place]ley Kerrie Timm, [place] [phone]

## 2018-09-01 NOTE — Clinical Social Work Placement (Signed)
   CLINICAL SOCIAL WORK PLACEMENT  NOTE  Date:  09/01/2018  Patient Details  Name: [name] Spears MRN: [id number] Date of Birth: 02/03/1961  Clinical Social Work is seeking post-discharge placement for this patient at the Skilled  Nursing Facility level of care (*CSW will initial, date and re-position this form in  chart as items are completed):      Patient/family provided with [facility] Clinical Social Work Department's list of facilities offering this level of care within the geographic area requested by the patient (or if unable, by the patient's family).  Yes   Patient/family informed of their freedom to choose among providers that offer the needed level of care, that participate in Medicare, Medicaid or managed care program needed by the patient, have an available bed and are willing to accept the patient.      Patient/family informed of [place]'s ownership interest [facility] and [facility], as well as of the fact that they are under no obligation to receive care at these facilities.  PASRR submitted to EDS on       PASRR number received on 08/27/18     Existing PASRR number confirmed on       FL2 transmitted to all facilities in geographic area requested by pt/family on 08/27/18     FL2 transmitted to all facilities within larger geographic area on       Patient informed that his/her managed care company has contracts with or will negotiate with certain facilities, including the following:        Yes   Patient/family informed of bed offers received.  Patient chooses bed at Other - please specify in the comment section below:([facility])     Physician recommends and patient chooses bed at      Patient to be transferred to Other - please specify in the comment section below:([facility]) on 09/01/18.  Patient to be transferred to facility by PTAR     Patient family notified on 09/01/18 of transfer.  Name of family member notified:  brother and daughter       PHYSICIAN       Additional Comment:    _______________________________________________ Gildardo GriffesAshley M Lakeisha Waldrop, LCSW 09/01/2018, 12:41 PM

## 2018-09-01 NOTE — Progress Notes (Signed)
Report called to RN at [facility].

## 2018-09-01 NOTE — Consult Note (Signed)
[facility] CM Primary Care Navigator  09/01/2018  [name] Spears November 03, 1960 [id number]   Met with patient at the bedside to identify possible discharge needs.  Patient reports having a '"broken left ankle"  that had led to this admission/ surgery.   Patient endorses Dr. Greig Right with [facility] as her primary care provider.   Patient is using [facility] in [place] to obtain medications without difficulty (with her new insurance- [employer])  Patient reports that she has been managing her own medications at home straight out of the containers.   Patient has been driving prior to admission/ surgery. Her brother Laverna Peace) or her daughter's fiance Foye Spurling.) will be able to provide transportation to her doctors' appointments after discharge.  Patient verbalized that daughter (bedridden) and fiance lives with her at home. Daughter's fiance will be able to provide assistance with care needs when discharged home, as stated. Patient mentioned of plan to join [facility] in [place] once discharge to home.  Anticipated discharge plan is skilled nursing facility per therapy recommendation (SNF- [facility]).   Patient understands that if she is not with [facility] program yet, to call primary care provider's office when she returns back home for a post discharge follow-up visit within 1- 2 weeks or sooner if needs arise. Patient letter (with PCP's contact number) was provided as a reminder.   Discussed with patient regarding [facility] CM services available for health management and resources at home and she denies any current needs for services at this point.  Patient expressed understanding to seek referral from primary care provider to [facility] care management if deemed necessary and appropriate for any services in the future- once she returns back home.   [facility] care management information was provided for future needs that she may have.     For additional questions  please contact:  Edwena Felty A. Tamaiya Bump, BSN, RN-BC [facility] PRIMARY CARE Navigator Cell: [phone]

## 2018-09-02 DIAGNOSIS — E559 Vitamin D deficiency, unspecified: Secondary | ICD-10-CM | POA: Diagnosis not present

## 2018-09-02 DIAGNOSIS — M1990 Unspecified osteoarthritis, unspecified site: Secondary | ICD-10-CM | POA: Diagnosis not present

## 2018-09-02 DIAGNOSIS — S82892A Other fracture of left lower leg, initial encounter for closed fracture: Secondary | ICD-10-CM | POA: Diagnosis not present

## 2018-09-02 DIAGNOSIS — G43009 Migraine without aura, not intractable, without status migrainosus: Secondary | ICD-10-CM | POA: Diagnosis not present

## 2018-09-04 DIAGNOSIS — S82892A Other fracture of left lower leg, initial encounter for closed fracture: Secondary | ICD-10-CM | POA: Diagnosis not present

## 2018-09-04 DIAGNOSIS — E785 Hyperlipidemia, unspecified: Secondary | ICD-10-CM | POA: Diagnosis not present

## 2018-09-04 DIAGNOSIS — G43009 Migraine without aura, not intractable, without status migrainosus: Secondary | ICD-10-CM | POA: Diagnosis not present

## 2018-09-04 DIAGNOSIS — I10 Essential (primary) hypertension: Secondary | ICD-10-CM | POA: Diagnosis not present

## 2018-09-09 DIAGNOSIS — R110 Nausea: Secondary | ICD-10-CM | POA: Diagnosis not present

## 2018-09-09 DIAGNOSIS — G43009 Migraine without aura, not intractable, without status migrainosus: Secondary | ICD-10-CM | POA: Diagnosis not present

## 2018-09-09 DIAGNOSIS — E785 Hyperlipidemia, unspecified: Secondary | ICD-10-CM | POA: Diagnosis not present

## 2018-09-09 DIAGNOSIS — S82892A Other fracture of left lower leg, initial encounter for closed fracture: Secondary | ICD-10-CM | POA: Diagnosis not present

## 2018-09-16 DIAGNOSIS — I10 Essential (primary) hypertension: Secondary | ICD-10-CM | POA: Diagnosis not present

## 2018-09-16 DIAGNOSIS — S82892A Other fracture of left lower leg, initial encounter for closed fracture: Secondary | ICD-10-CM | POA: Diagnosis not present

## 2018-09-16 DIAGNOSIS — R110 Nausea: Secondary | ICD-10-CM | POA: Diagnosis not present

## 2018-09-16 DIAGNOSIS — E785 Hyperlipidemia, unspecified: Secondary | ICD-10-CM | POA: Diagnosis not present

## 2018-09-23 DIAGNOSIS — E785 Hyperlipidemia, unspecified: Secondary | ICD-10-CM | POA: Diagnosis not present

## 2018-09-23 DIAGNOSIS — B372 Candidiasis of skin and nail: Secondary | ICD-10-CM | POA: Diagnosis not present

## 2018-09-23 DIAGNOSIS — M1990 Unspecified osteoarthritis, unspecified site: Secondary | ICD-10-CM | POA: Diagnosis not present

## 2018-09-23 DIAGNOSIS — R110 Nausea: Secondary | ICD-10-CM | POA: Diagnosis not present

## 2018-09-30 DIAGNOSIS — R110 Nausea: Secondary | ICD-10-CM | POA: Diagnosis not present

## 2018-09-30 DIAGNOSIS — G43009 Migraine without aura, not intractable, without status migrainosus: Secondary | ICD-10-CM | POA: Diagnosis not present

## 2018-09-30 DIAGNOSIS — S82872D Displaced pilon fracture of left tibia, subsequent encounter for closed fracture with routine healing: Secondary | ICD-10-CM | POA: Diagnosis not present

## 2018-09-30 DIAGNOSIS — S82892A Other fracture of left lower leg, initial encounter for closed fracture: Secondary | ICD-10-CM | POA: Diagnosis not present

## 2018-09-30 DIAGNOSIS — E785 Hyperlipidemia, unspecified: Secondary | ICD-10-CM | POA: Diagnosis not present

## 2018-10-02 DIAGNOSIS — I10 Essential (primary) hypertension: Secondary | ICD-10-CM | POA: Diagnosis not present

## 2018-10-02 DIAGNOSIS — E785 Hyperlipidemia, unspecified: Secondary | ICD-10-CM | POA: Diagnosis not present

## 2018-10-02 DIAGNOSIS — R110 Nausea: Secondary | ICD-10-CM | POA: Diagnosis not present

## 2018-10-02 DIAGNOSIS — G43009 Migraine without aura, not intractable, without status migrainosus: Secondary | ICD-10-CM | POA: Diagnosis not present

## 2018-10-21 DIAGNOSIS — S82872D Displaced pilon fracture of left tibia, subsequent encounter for closed fracture with routine healing: Secondary | ICD-10-CM | POA: Diagnosis not present

## 2018-10-21 DIAGNOSIS — M80862A Other osteoporosis with current pathological fracture, left lower leg, initial encounter for fracture: Secondary | ICD-10-CM | POA: Diagnosis not present

## 2018-10-28 DIAGNOSIS — R002 Palpitations: Secondary | ICD-10-CM | POA: Diagnosis not present

## 2018-10-28 DIAGNOSIS — S82892A Other fracture of left lower leg, initial encounter for closed fracture: Secondary | ICD-10-CM | POA: Diagnosis not present

## 2018-10-28 DIAGNOSIS — R110 Nausea: Secondary | ICD-10-CM | POA: Diagnosis not present

## 2018-10-28 DIAGNOSIS — E785 Hyperlipidemia, unspecified: Secondary | ICD-10-CM | POA: Diagnosis not present

## 2018-10-30 DIAGNOSIS — R110 Nausea: Secondary | ICD-10-CM | POA: Diagnosis not present

## 2018-10-30 DIAGNOSIS — S82892A Other fracture of left lower leg, initial encounter for closed fracture: Secondary | ICD-10-CM | POA: Diagnosis not present

## 2018-10-30 DIAGNOSIS — G43009 Migraine without aura, not intractable, without status migrainosus: Secondary | ICD-10-CM | POA: Diagnosis not present

## 2018-10-30 DIAGNOSIS — E785 Hyperlipidemia, unspecified: Secondary | ICD-10-CM | POA: Diagnosis not present

## 2018-11-12 DIAGNOSIS — S82872D Displaced pilon fracture of left tibia, subsequent encounter for closed fracture with routine healing: Secondary | ICD-10-CM | POA: Diagnosis not present

## 2018-11-12 DIAGNOSIS — M80862D Other osteoporosis with current pathological fracture, left lower leg, subsequent encounter for fracture with routine healing: Secondary | ICD-10-CM | POA: Diagnosis not present

## 2018-11-27 DIAGNOSIS — E785 Hyperlipidemia, unspecified: Secondary | ICD-10-CM | POA: Diagnosis not present

## 2018-11-27 DIAGNOSIS — G43009 Migraine without aura, not intractable, without status migrainosus: Secondary | ICD-10-CM | POA: Diagnosis not present

## 2018-11-27 DIAGNOSIS — S82892A Other fracture of left lower leg, initial encounter for closed fracture: Secondary | ICD-10-CM | POA: Diagnosis not present

## 2018-11-27 DIAGNOSIS — R110 Nausea: Secondary | ICD-10-CM | POA: Diagnosis not present

## 2018-12-02 DIAGNOSIS — S82892A Other fracture of left lower leg, initial encounter for closed fracture: Secondary | ICD-10-CM | POA: Diagnosis not present

## 2018-12-02 DIAGNOSIS — G43009 Migraine without aura, not intractable, without status migrainosus: Secondary | ICD-10-CM | POA: Diagnosis not present

## 2018-12-02 DIAGNOSIS — E785 Hyperlipidemia, unspecified: Secondary | ICD-10-CM | POA: Diagnosis not present

## 2018-12-02 DIAGNOSIS — I10 Essential (primary) hypertension: Secondary | ICD-10-CM | POA: Diagnosis not present

## 2018-12-04 DIAGNOSIS — M6281 Muscle weakness (generalized): Secondary | ICD-10-CM | POA: Diagnosis not present

## 2018-12-04 DIAGNOSIS — Z9049 Acquired absence of other specified parts of digestive tract: Secondary | ICD-10-CM | POA: Diagnosis not present

## 2018-12-04 DIAGNOSIS — R262 Difficulty in walking, not elsewhere classified: Secondary | ICD-10-CM | POA: Diagnosis not present

## 2018-12-04 DIAGNOSIS — S82872D Displaced pilon fracture of left tibia, subsequent encounter for closed fracture with routine healing: Secondary | ICD-10-CM | POA: Diagnosis not present

## 2018-12-04 DIAGNOSIS — S82876D Nondisplaced pilon fracture of unspecified tibia, subsequent encounter for closed fracture with routine healing: Secondary | ICD-10-CM | POA: Diagnosis not present

## 2018-12-24 DIAGNOSIS — S82102A Unspecified fracture of upper end of left tibia, initial encounter for closed fracture: Secondary | ICD-10-CM | POA: Diagnosis not present

## 2019-07-29 DIAGNOSIS — R262 Difficulty in walking, not elsewhere classified: Secondary | ICD-10-CM | POA: Diagnosis not present

## 2019-07-29 DIAGNOSIS — I890 Lymphedema, not elsewhere classified: Secondary | ICD-10-CM | POA: Diagnosis not present

## 2019-07-29 DIAGNOSIS — L03116 Cellulitis of left lower limb: Secondary | ICD-10-CM | POA: Diagnosis not present

## 2019-07-29 DIAGNOSIS — L89301 Pressure ulcer of unspecified buttock, stage 1: Secondary | ICD-10-CM | POA: Diagnosis not present

## 2019-07-29 DIAGNOSIS — I10 Essential (primary) hypertension: Secondary | ICD-10-CM | POA: Diagnosis not present

## 2019-07-29 DIAGNOSIS — L03115 Cellulitis of right lower limb: Secondary | ICD-10-CM | POA: Diagnosis not present

## 2019-07-29 DIAGNOSIS — M79604 Pain in right leg: Secondary | ICD-10-CM | POA: Diagnosis not present

## 2019-07-29 DIAGNOSIS — M7989 Other specified soft tissue disorders: Secondary | ICD-10-CM | POA: Diagnosis not present

## 2019-07-29 DIAGNOSIS — M79605 Pain in left leg: Secondary | ICD-10-CM | POA: Diagnosis not present

## 2019-07-29 DIAGNOSIS — R600 Localized edema: Secondary | ICD-10-CM | POA: Diagnosis not present

## 2019-07-29 DIAGNOSIS — S8262XD Displaced fracture of lateral malleolus of left fibula, subsequent encounter for closed fracture with routine healing: Secondary | ICD-10-CM | POA: Diagnosis not present

## 2019-07-29 DIAGNOSIS — R609 Edema, unspecified: Secondary | ICD-10-CM | POA: Diagnosis not present

## 2019-07-29 DIAGNOSIS — I517 Cardiomegaly: Secondary | ICD-10-CM | POA: Diagnosis not present

## 2019-07-30 DIAGNOSIS — L89301 Pressure ulcer of unspecified buttock, stage 1: Secondary | ICD-10-CM | POA: Diagnosis not present

## 2019-07-30 DIAGNOSIS — M7989 Other specified soft tissue disorders: Secondary | ICD-10-CM | POA: Diagnosis not present

## 2019-07-30 DIAGNOSIS — I890 Lymphedema, not elsewhere classified: Secondary | ICD-10-CM | POA: Diagnosis not present

## 2019-07-30 DIAGNOSIS — I5033 Acute on chronic diastolic (congestive) heart failure: Secondary | ICD-10-CM | POA: Diagnosis not present

## 2019-07-30 DIAGNOSIS — M79605 Pain in left leg: Secondary | ICD-10-CM | POA: Diagnosis not present

## 2019-07-30 DIAGNOSIS — E039 Hypothyroidism, unspecified: Secondary | ICD-10-CM | POA: Diagnosis not present

## 2019-07-30 DIAGNOSIS — Z6841 Body Mass Index (BMI) 40.0 and over, adult: Secondary | ICD-10-CM | POA: Diagnosis not present

## 2019-07-30 DIAGNOSIS — M79604 Pain in right leg: Secondary | ICD-10-CM | POA: Diagnosis not present

## 2019-07-30 DIAGNOSIS — M160 Bilateral primary osteoarthritis of hip: Secondary | ICD-10-CM | POA: Diagnosis not present

## 2019-07-30 DIAGNOSIS — S8262XD Displaced fracture of lateral malleolus of left fibula, subsequent encounter for closed fracture with routine healing: Secondary | ICD-10-CM | POA: Diagnosis not present

## 2019-07-30 DIAGNOSIS — R609 Edema, unspecified: Secondary | ICD-10-CM | POA: Diagnosis not present

## 2019-07-30 DIAGNOSIS — I110 Hypertensive heart disease with heart failure: Secondary | ICD-10-CM | POA: Diagnosis not present

## 2019-07-30 DIAGNOSIS — K219 Gastro-esophageal reflux disease without esophagitis: Secondary | ICD-10-CM | POA: Diagnosis not present

## 2019-07-30 DIAGNOSIS — R262 Difficulty in walking, not elsewhere classified: Secondary | ICD-10-CM | POA: Diagnosis not present

## 2019-07-30 DIAGNOSIS — L03116 Cellulitis of left lower limb: Secondary | ICD-10-CM | POA: Diagnosis not present

## 2019-07-30 DIAGNOSIS — R600 Localized edema: Secondary | ICD-10-CM | POA: Diagnosis not present

## 2019-07-30 DIAGNOSIS — L03115 Cellulitis of right lower limb: Secondary | ICD-10-CM | POA: Diagnosis not present

## 2019-07-31 DIAGNOSIS — R609 Edema, unspecified: Secondary | ICD-10-CM | POA: Diagnosis not present

## 2019-07-31 DIAGNOSIS — R262 Difficulty in walking, not elsewhere classified: Secondary | ICD-10-CM | POA: Diagnosis not present

## 2019-07-31 DIAGNOSIS — I890 Lymphedema, not elsewhere classified: Secondary | ICD-10-CM | POA: Diagnosis not present

## 2019-08-01 DIAGNOSIS — R262 Difficulty in walking, not elsewhere classified: Secondary | ICD-10-CM | POA: Diagnosis not present

## 2019-08-01 DIAGNOSIS — I890 Lymphedema, not elsewhere classified: Secondary | ICD-10-CM | POA: Diagnosis not present

## 2019-08-01 DIAGNOSIS — R609 Edema, unspecified: Secondary | ICD-10-CM | POA: Diagnosis not present

## 2019-08-02 DIAGNOSIS — I890 Lymphedema, not elsewhere classified: Secondary | ICD-10-CM | POA: Diagnosis not present

## 2019-08-02 DIAGNOSIS — R609 Edema, unspecified: Secondary | ICD-10-CM | POA: Diagnosis not present

## 2019-08-02 DIAGNOSIS — R262 Difficulty in walking, not elsewhere classified: Secondary | ICD-10-CM | POA: Diagnosis not present

## 2019-08-03 DIAGNOSIS — I890 Lymphedema, not elsewhere classified: Secondary | ICD-10-CM | POA: Diagnosis not present

## 2019-08-03 DIAGNOSIS — R609 Edema, unspecified: Secondary | ICD-10-CM | POA: Diagnosis not present

## 2019-08-03 DIAGNOSIS — R262 Difficulty in walking, not elsewhere classified: Secondary | ICD-10-CM | POA: Diagnosis not present

## 2019-08-04 DIAGNOSIS — I890 Lymphedema, not elsewhere classified: Secondary | ICD-10-CM | POA: Diagnosis not present

## 2019-08-04 DIAGNOSIS — R262 Difficulty in walking, not elsewhere classified: Secondary | ICD-10-CM | POA: Diagnosis not present

## 2019-08-04 DIAGNOSIS — R609 Edema, unspecified: Secondary | ICD-10-CM | POA: Diagnosis not present

## 2019-09-03 DIAGNOSIS — I10 Essential (primary) hypertension: Secondary | ICD-10-CM | POA: Diagnosis not present

## 2019-09-03 DIAGNOSIS — E7800 Pure hypercholesterolemia, unspecified: Secondary | ICD-10-CM | POA: Diagnosis not present

## 2019-09-03 DIAGNOSIS — I509 Heart failure, unspecified: Secondary | ICD-10-CM | POA: Diagnosis not present

## 2019-11-04 DIAGNOSIS — K5901 Slow transit constipation: Secondary | ICD-10-CM | POA: Diagnosis not present

## 2019-11-04 DIAGNOSIS — I5032 Chronic diastolic (congestive) heart failure: Secondary | ICD-10-CM | POA: Diagnosis not present

## 2019-11-04 DIAGNOSIS — I10 Essential (primary) hypertension: Secondary | ICD-10-CM | POA: Diagnosis not present

## 2020-02-25 DIAGNOSIS — R7309 Other abnormal glucose: Secondary | ICD-10-CM | POA: Diagnosis not present

## 2020-02-25 DIAGNOSIS — I5032 Chronic diastolic (congestive) heart failure: Secondary | ICD-10-CM | POA: Diagnosis not present

## 2020-02-25 DIAGNOSIS — I10 Essential (primary) hypertension: Secondary | ICD-10-CM | POA: Diagnosis not present

## 2020-05-03 IMAGING — RF DG ANKLE COMPLETE 3+V*L*
1 series · 5 of 5 positions shown · non-contrast
Comparison: None.

CLINICAL DATA: Fracture repair.

FLUOROSCOPY TIME:  52 seconds.
Images: 5
EXAM:
DG C-ARM 61-120 MIN

[Series 1: run · 5 of 5 slices shown]
[im 1/5]
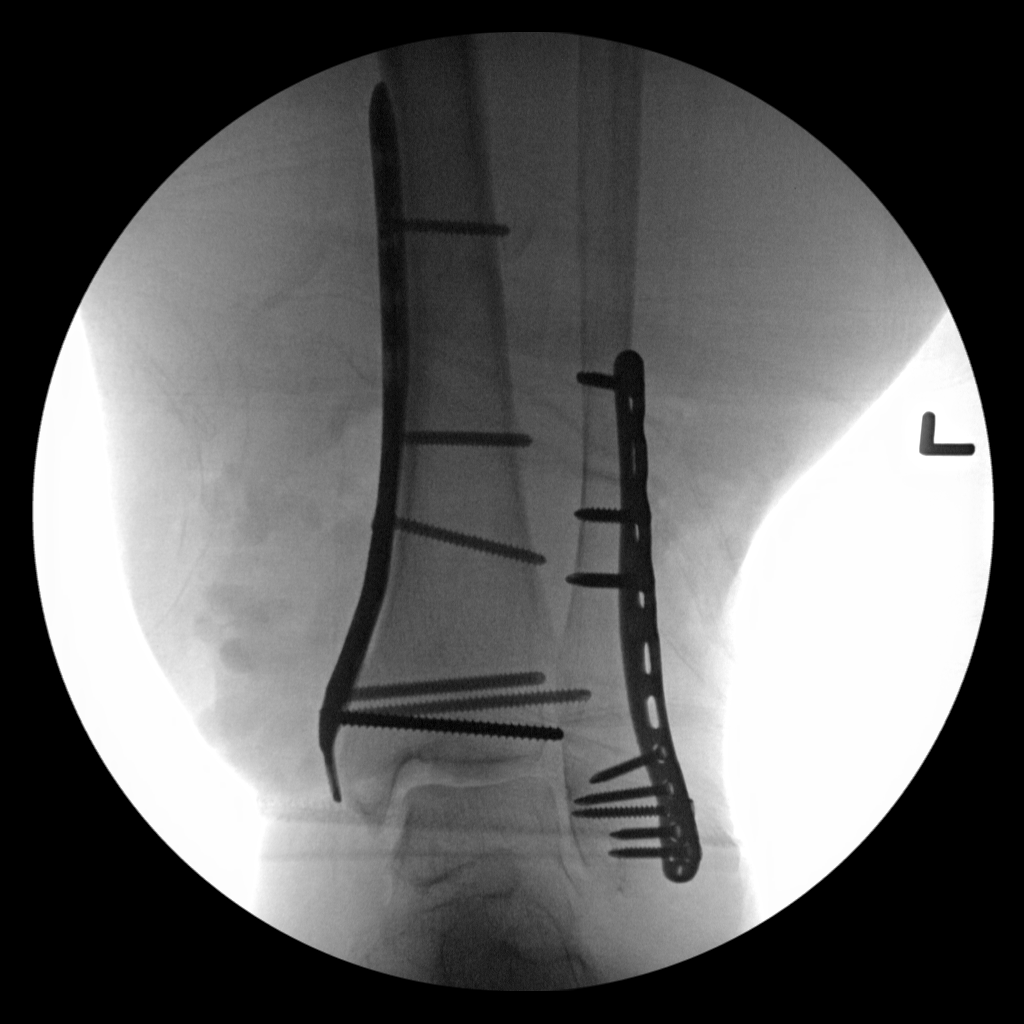
[im 2/5]
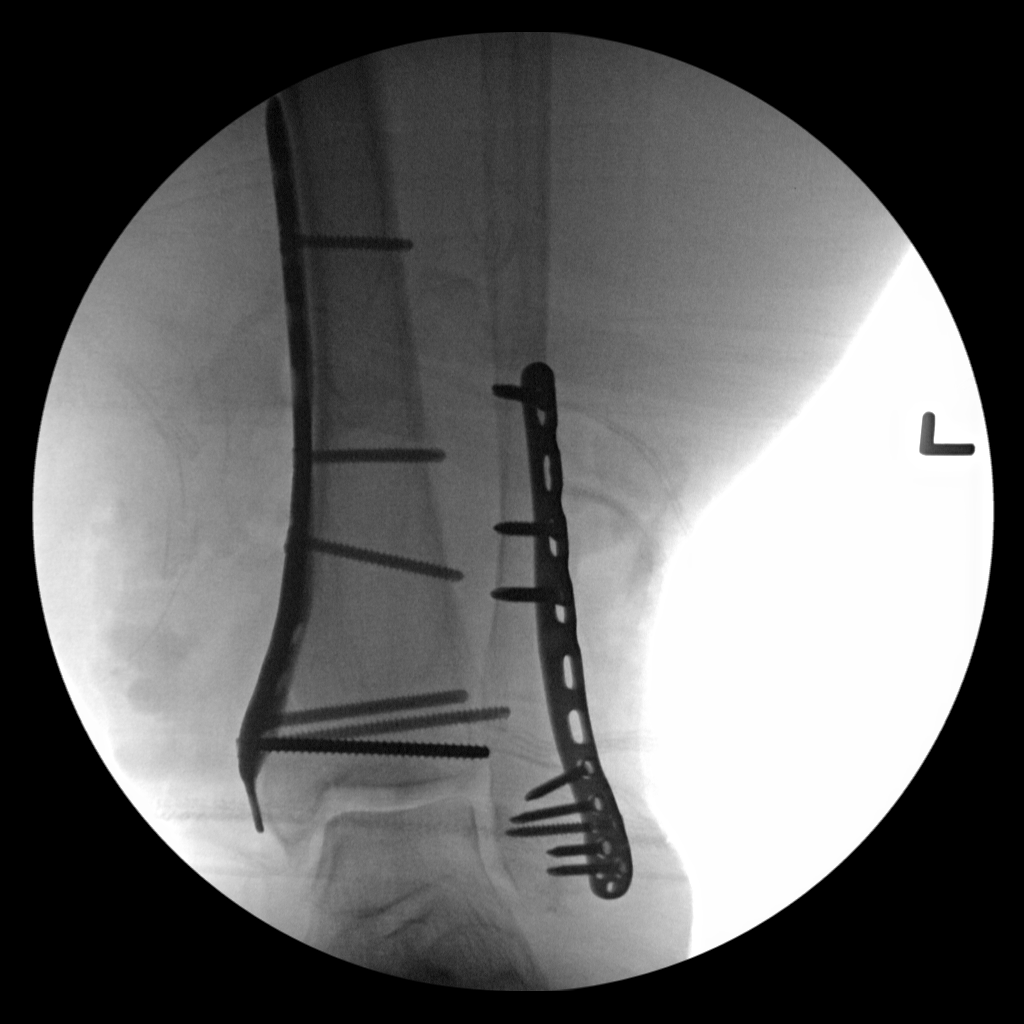
[im 3/5]
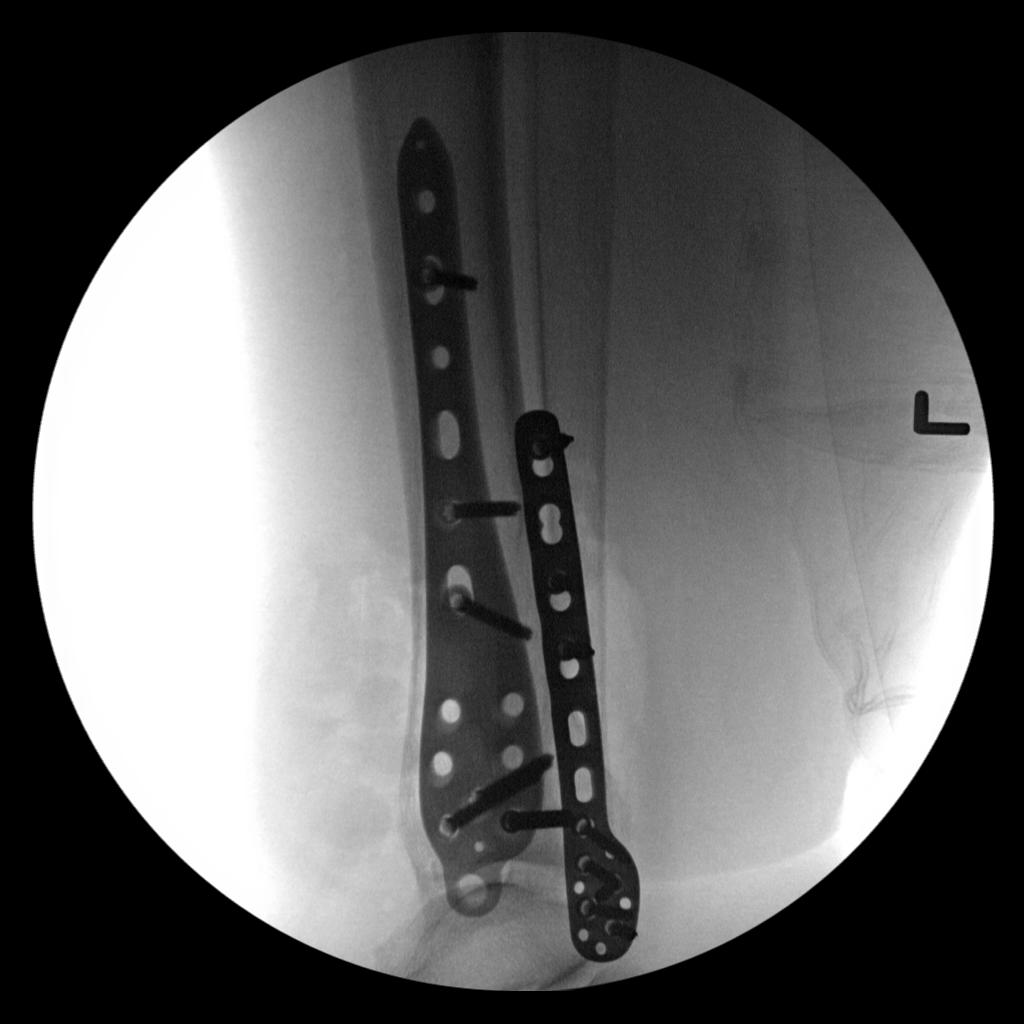
[im 4/5]
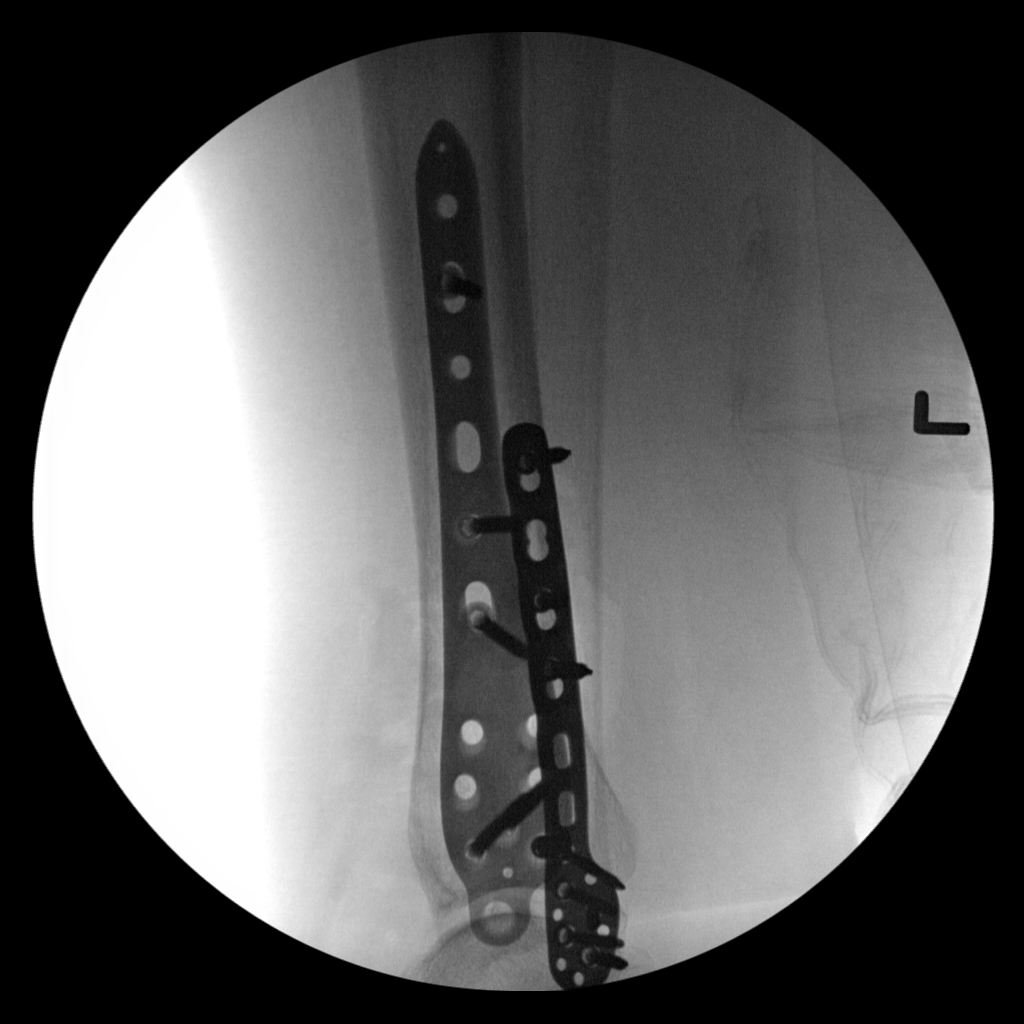
[im 5/5]
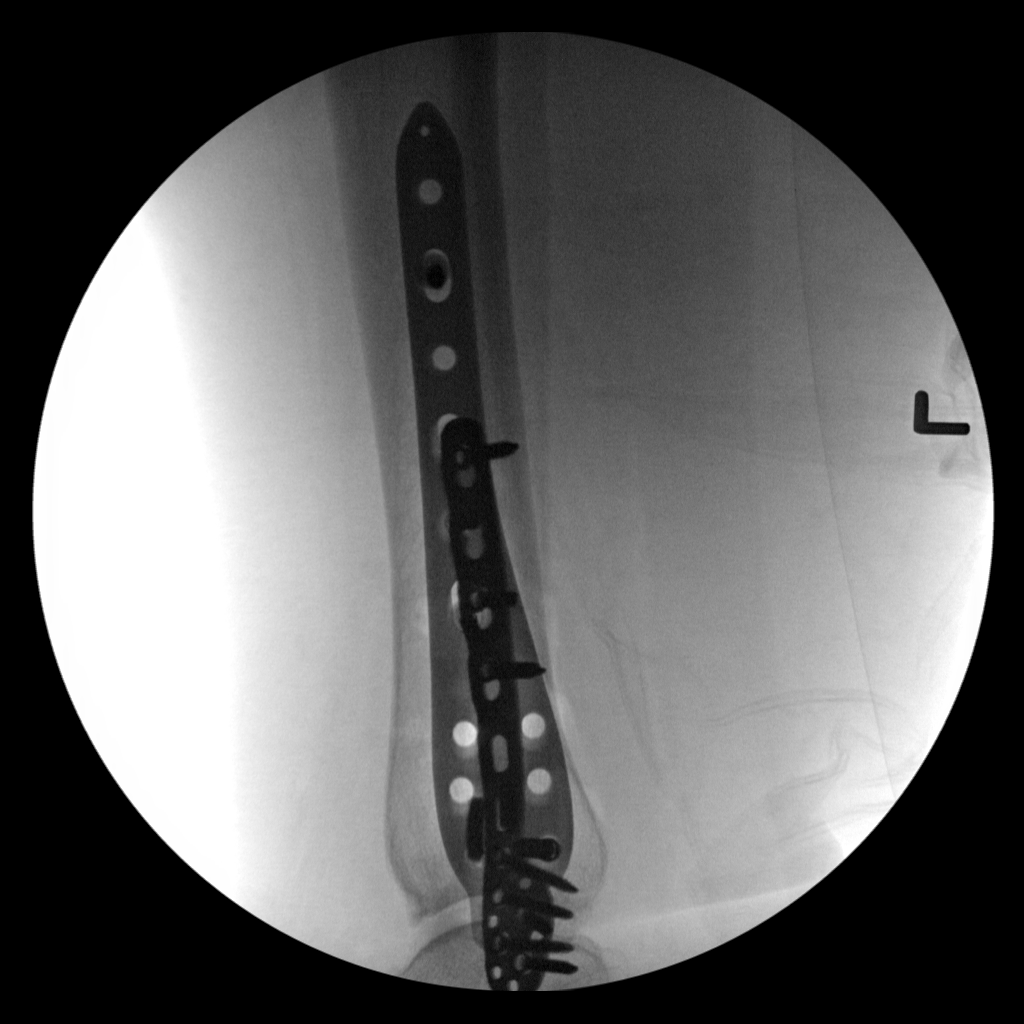

[5 of 5 positions shown; findings below may reference images not displayed]

FINDINGS: Placed been attached to the distal fibula and tibia with multiple
screws. The ankle mortise is grossly intact. No other acute
abnormalities.
IMPRESSION: Fracture repair as above.

## 2020-07-13 DIAGNOSIS — I10 Essential (primary) hypertension: Secondary | ICD-10-CM | POA: Diagnosis not present

## 2020-07-13 DIAGNOSIS — Z0000 Encounter for general adult medical examination without abnormal findings: Secondary | ICD-10-CM | POA: Diagnosis not present

## 2020-07-13 DIAGNOSIS — Z23 Encounter for immunization: Secondary | ICD-10-CM | POA: Diagnosis not present

## 2021-01-12 DIAGNOSIS — R600 Localized edema: Secondary | ICD-10-CM | POA: Diagnosis not present

## 2021-01-12 DIAGNOSIS — M160 Bilateral primary osteoarthritis of hip: Secondary | ICD-10-CM | POA: Diagnosis not present

## 2021-01-12 DIAGNOSIS — I5032 Chronic diastolic (congestive) heart failure: Secondary | ICD-10-CM | POA: Diagnosis not present

## 2021-01-12 DIAGNOSIS — I10 Essential (primary) hypertension: Secondary | ICD-10-CM | POA: Diagnosis not present

## 2021-01-16 DIAGNOSIS — R600 Localized edema: Secondary | ICD-10-CM | POA: Diagnosis not present

## 2021-01-16 DIAGNOSIS — M79661 Pain in right lower leg: Secondary | ICD-10-CM | POA: Diagnosis not present

## 2021-01-16 DIAGNOSIS — M79604 Pain in right leg: Secondary | ICD-10-CM | POA: Diagnosis not present

## 2021-01-16 DIAGNOSIS — M7989 Other specified soft tissue disorders: Secondary | ICD-10-CM | POA: Diagnosis not present

## 2021-02-13 DIAGNOSIS — R229 Localized swelling, mass and lump, unspecified: Secondary | ICD-10-CM | POA: Diagnosis not present

## 2021-02-13 DIAGNOSIS — R946 Abnormal results of thyroid function studies: Secondary | ICD-10-CM | POA: Diagnosis not present

## 2021-02-13 DIAGNOSIS — R600 Localized edema: Secondary | ICD-10-CM | POA: Diagnosis not present

## 2021-08-10 DIAGNOSIS — R946 Abnormal results of thyroid function studies: Secondary | ICD-10-CM | POA: Diagnosis not present

## 2021-08-10 DIAGNOSIS — I5032 Chronic diastolic (congestive) heart failure: Secondary | ICD-10-CM | POA: Diagnosis not present

## 2021-08-10 DIAGNOSIS — Z23 Encounter for immunization: Secondary | ICD-10-CM | POA: Diagnosis not present

## 2021-08-10 DIAGNOSIS — M160 Bilateral primary osteoarthritis of hip: Secondary | ICD-10-CM | POA: Diagnosis not present

## 2021-08-10 DIAGNOSIS — E7800 Pure hypercholesterolemia, unspecified: Secondary | ICD-10-CM | POA: Diagnosis not present

## 2021-08-10 DIAGNOSIS — I10 Essential (primary) hypertension: Secondary | ICD-10-CM | POA: Diagnosis not present

## 2021-08-10 DIAGNOSIS — Z0000 Encounter for general adult medical examination without abnormal findings: Secondary | ICD-10-CM | POA: Diagnosis not present

## 2021-08-10 DIAGNOSIS — R7301 Impaired fasting glucose: Secondary | ICD-10-CM | POA: Diagnosis not present

## 2022-05-24 DIAGNOSIS — I5032 Chronic diastolic (congestive) heart failure: Secondary | ICD-10-CM | POA: Diagnosis not present

## 2022-05-24 DIAGNOSIS — E039 Hypothyroidism, unspecified: Secondary | ICD-10-CM | POA: Diagnosis not present

## 2022-05-24 DIAGNOSIS — M160 Bilateral primary osteoarthritis of hip: Secondary | ICD-10-CM | POA: Diagnosis not present

## 2022-05-24 DIAGNOSIS — I10 Essential (primary) hypertension: Secondary | ICD-10-CM | POA: Diagnosis not present

## 2023-01-14 DIAGNOSIS — E039 Hypothyroidism, unspecified: Secondary | ICD-10-CM | POA: Diagnosis not present

## 2023-01-14 DIAGNOSIS — Z23 Encounter for immunization: Secondary | ICD-10-CM | POA: Diagnosis not present

## 2023-01-14 DIAGNOSIS — I5032 Chronic diastolic (congestive) heart failure: Secondary | ICD-10-CM | POA: Diagnosis not present

## 2023-01-14 DIAGNOSIS — I10 Essential (primary) hypertension: Secondary | ICD-10-CM | POA: Diagnosis not present

## 2023-01-14 DIAGNOSIS — M160 Bilateral primary osteoarthritis of hip: Secondary | ICD-10-CM | POA: Diagnosis not present

## 2023-01-14 DIAGNOSIS — Z79899 Other long term (current) drug therapy: Secondary | ICD-10-CM | POA: Diagnosis not present

## 2023-01-15 DIAGNOSIS — E7800 Pure hypercholesterolemia, unspecified: Secondary | ICD-10-CM | POA: Diagnosis not present

## 2023-01-15 DIAGNOSIS — E039 Hypothyroidism, unspecified: Secondary | ICD-10-CM | POA: Diagnosis not present

## 2023-01-15 DIAGNOSIS — R7301 Impaired fasting glucose: Secondary | ICD-10-CM | POA: Diagnosis not present

## 2023-06-05 DIAGNOSIS — E039 Hypothyroidism, unspecified: Secondary | ICD-10-CM | POA: Diagnosis not present

## 2023-06-05 DIAGNOSIS — I493 Ventricular premature depolarization: Secondary | ICD-10-CM | POA: Diagnosis not present

## 2023-06-05 DIAGNOSIS — M549 Dorsalgia, unspecified: Secondary | ICD-10-CM | POA: Diagnosis not present

## 2023-06-05 DIAGNOSIS — I10 Essential (primary) hypertension: Secondary | ICD-10-CM | POA: Diagnosis not present

## 2023-06-05 DIAGNOSIS — M545 Low back pain, unspecified: Secondary | ICD-10-CM | POA: Diagnosis not present

## 2023-06-05 DIAGNOSIS — Z79899 Other long term (current) drug therapy: Secondary | ICD-10-CM | POA: Diagnosis not present

## 2023-06-05 DIAGNOSIS — I890 Lymphedema, not elsewhere classified: Secondary | ICD-10-CM | POA: Diagnosis not present

## 2023-06-05 DIAGNOSIS — R609 Edema, unspecified: Secondary | ICD-10-CM | POA: Diagnosis not present

## 2023-06-05 DIAGNOSIS — M25551 Pain in right hip: Secondary | ICD-10-CM | POA: Diagnosis not present

## 2023-06-05 DIAGNOSIS — Z743 Need for continuous supervision: Secondary | ICD-10-CM | POA: Diagnosis not present

## 2023-06-05 DIAGNOSIS — L03317 Cellulitis of buttock: Secondary | ICD-10-CM | POA: Diagnosis not present

## 2023-06-05 DIAGNOSIS — K219 Gastro-esophageal reflux disease without esophagitis: Secondary | ICD-10-CM | POA: Diagnosis not present

## 2023-07-09 ENCOUNTER — Telehealth: Payer: Self-pay

## 2023-07-09 NOTE — Patient Outreach (Signed)
[facility] Assistant attempted to call patient on today regarding preventative mammogram screening. Phone was answered then hung up. Assistant unable to leave confidential voicemail for patient to return call.  Will call back patient back for final attempt.  [name] Spears [facility] Assistant [facility] Population [facility] [phone]

## 2023-07-19 ENCOUNTER — Emergency Department (HOSPITAL_COMMUNITY): Payer: Medicare Other

## 2023-07-19 ENCOUNTER — Emergency Department (HOSPITAL_COMMUNITY)
Admission: EM | Admit: 2023-07-19 | Discharge: 2023-07-20 | Disposition: A | Discharge status: Home / Self Care | Payer: Medicare Other | Attending: Emergency Medicine | Admitting: Emergency Medicine

## 2023-07-19 ENCOUNTER — Other Ambulatory Visit: Payer: Self-pay

## 2023-07-19 DIAGNOSIS — R9389 Abnormal findings on diagnostic imaging of other specified body structures: Secondary | ICD-10-CM | POA: Diagnosis not present

## 2023-07-19 DIAGNOSIS — S80821A Blister (nonthermal), right lower leg, initial encounter: Secondary | ICD-10-CM | POA: Diagnosis not present

## 2023-07-19 DIAGNOSIS — Z743 Need for continuous supervision: Secondary | ICD-10-CM | POA: Diagnosis not present

## 2023-07-19 DIAGNOSIS — I10 Essential (primary) hypertension: Secondary | ICD-10-CM | POA: Diagnosis not present

## 2023-07-19 DIAGNOSIS — L89892 Pressure ulcer of other site, stage 2: Secondary | ICD-10-CM | POA: Diagnosis not present

## 2023-07-19 DIAGNOSIS — M79662 Pain in left lower leg: Secondary | ICD-10-CM | POA: Diagnosis present

## 2023-07-19 DIAGNOSIS — R609 Edema, unspecified: Secondary | ICD-10-CM | POA: Diagnosis not present

## 2023-07-19 DIAGNOSIS — Z0389 Encounter for observation for other suspected diseases and conditions ruled out: Secondary | ICD-10-CM | POA: Diagnosis not present

## 2023-07-19 NOTE — ED Triage Notes (Signed)
Pt from home. 400+lbs. C/O left leg from knee down red,swelling, big blisters. Pain 9/10.no allergies. Hx htn, obesity, hypothyroidism, cellulitis. Baseline can stand and walk 2 steps. Uses walker to get around at home

## 2023-07-19 NOTE — ED Provider Notes (Signed)
[place] EMERGENCY DEPARTMENT AT [facility] Provider Note   CSN: [id number] Arrival date & time: 07/19/23  2317     History {Add pertinent medical, surgical, social history, OB history to HPI:1} No chief complaint on file.   [name] Spears is a 62 y.o. female.  Patient presents to the emergency department for evaluation of burning pain, blisters and draining from the outside portion of her right lower leg.  She reports that symptoms began several months ago.  She was seen at [facility] and given a prescription for antibiotics, it sounds like she did not take the medicine.  Patient reports that symptoms have gotten worse.       Home Medications Prior to Admission medications   Medication Sig Start Date End Date Taking? Authorizing Provider  calcium citrate (CALCITRATE - DOSED IN MG ELEMENTAL CALCIUM) 950 MG tablet Take 1 tablet (200 mg of elemental calcium total) by mouth 2 (two) times daily. 08/29/18   Montez Morita, PA-C  cholecalciferol (VITAMIN D) 25 MCG (1000 UT) tablet Take 2 tablets (2,000 Units total) by mouth daily. 08/29/18   Montez Morita, PA-C  docusate sodium (COLACE) 100 MG capsule Take 1 capsule (100 mg total) by mouth 2 (two) times daily. 08/29/18   Montez Morita, PA-C  enoxaparin (LOVENOX) 100 MG/ML injection Inject 1 mL (100 mg total) into the skin daily for 14 days. 08/30/18 09/13/18  Montez Morita, PA-C  HYDROcodone-acetaminophen (NORCO) 7.5-325 MG tablet Take 1-2 tablets by mouth every 6 (six) hours as needed for severe pain. 08/29/18   Montez Morita, PA-C  magnesium oxide (MAG-OX) 400 (241.3 Mg) MG tablet Take 0.5 tablets (200 mg total) by mouth daily. 08/29/18   Montez Morita, PA-C  metoprolol tartrate (LOPRESSOR) 25 MG tablet Take 25 mg by mouth daily. 07/25/18   [provider]  vitamin C (VITAMIN C) 500 MG tablet Take 1 tablet (500 mg total) by mouth daily. 08/29/18   Montez Morita, PA-C  Vitamin D, Ergocalciferol, (DRISDOL) 1.25 MG (50000 UT) CAPS  capsule Take 1 capsule (50,000 Units total) by mouth every 7 (seven) days. 08/29/18   Montez Morita, PA-C      Allergies    Patient has no known allergies.    Review of Systems   Review of Systems  Physical Exam Updated Vital Signs There were no vitals taken for this visit. Physical Exam Vitals and nursing note reviewed.  Constitutional:      General: She is not in acute distress.    Appearance: She is well-developed.  HENT:     Head: Normocephalic and atraumatic.     Mouth/Throat:     Mouth: Mucous membranes are moist.  Eyes:     General: Vision grossly intact. Gaze aligned appropriately.     Extraocular Movements: Extraocular movements intact.     Conjunctiva/sclera: Conjunctivae normal.  Cardiovascular:     Rate and Rhythm: Normal rate and regular rhythm.     Pulses: Normal pulses.     Heart sounds: Normal heart sounds, S1 normal and S2 normal. No murmur heard.    No friction rub. No gallop.  Pulmonary:     Effort: Pulmonary effort is normal. No respiratory distress.     Breath sounds: Normal breath sounds.  Abdominal:     General: Bowel sounds are normal.     Palpations: Abdomen is soft.     Tenderness: There is no abdominal tenderness. There is no guarding or rebound.     Hernia: No hernia is present.  Musculoskeletal:        General: No swelling.     Cervical back: Full passive range of motion without pain, normal range of motion and neck supple. No spinous process tenderness or muscular tenderness. Normal range of motion.     Right lower leg: No edema.     Left lower leg: No edema.     Comments: Massive lymphedema bilateral legs   Skin:    General: Skin is warm and dry.     Capillary Refill: Capillary refill takes less than 2 seconds.     Findings: Wound (multiple non-infected partial thickness decubiti lateral aspect of R lower leg) present. No ecchymosis, erythema or rash.  Neurological:     General: No focal deficit present.     Mental Status: She is alert  and oriented to person, place, and time.     GCS: GCS eye subscore is 4. GCS verbal subscore is 5. GCS motor subscore is 6.     Cranial Nerves: Cranial nerves 2-12 are intact.     Sensory: Sensation is intact.     Motor: Motor function is intact.     Coordination: Coordination is intact.  Psychiatric:        Attention and Perception: Attention normal.        Mood and Affect: Mood normal.        Speech: Speech normal.        Behavior: Behavior normal.     ED Results / Procedures / Treatments   Labs (all labs ordered are listed, but only abnormal results are displayed) Labs Reviewed  CULTURE, BLOOD (ROUTINE X 2)  CULTURE, BLOOD (ROUTINE X 2)  COMPREHENSIVE METABOLIC PANEL  CBC WITH DIFFERENTIAL/PLATELET  PROTIME-INR  APTT  URINALYSIS, W/ REFLEX TO CULTURE (INFECTION SUSPECTED)  I-STAT CG4 LACTIC ACID, ED    EKG None  Radiology No results found.  Procedures Procedures  {Document cardiac monitor, telemetry assessment procedure when appropriate:1}  Medications Ordered in ED Medications - No data to display  ED Course/ Medical Decision Making/ A&P   {   Click here for ABCD2, HEART and other calculatorsREFRESH Note before signing :1}                              Medical Decision Making Amount and/or Complexity of Data Reviewed Labs: ordered. Radiology: ordered. ECG/medicine tests: ordered.   ***  {Document critical care time when appropriate:1} {Document review of labs and clinical decision tools ie heart score, Chads2Vasc2 etc:1}  {Document your independent review of radiology images, and any outside records:1} {Document your discussion with family members, caretakers, and with consultants:1} {Document social determinants of health affecting pt's care:1} {Document your decision making why or why not admission, treatments were needed:1} Final Clinical Impression(s) / ED Diagnoses Final diagnoses:  None    Rx / DC Orders ED Discharge Orders     None

## 2023-07-20 DIAGNOSIS — Z0389 Encounter for observation for other suspected diseases and conditions ruled out: Secondary | ICD-10-CM | POA: Diagnosis not present

## 2023-07-20 DIAGNOSIS — Z7401 Bed confinement status: Secondary | ICD-10-CM | POA: Diagnosis not present

## 2023-07-20 DIAGNOSIS — R9389 Abnormal findings on diagnostic imaging of other specified body structures: Secondary | ICD-10-CM | POA: Diagnosis not present

## 2023-07-20 DIAGNOSIS — R531 Weakness: Secondary | ICD-10-CM | POA: Diagnosis not present

## 2023-07-20 DIAGNOSIS — Z743 Need for continuous supervision: Secondary | ICD-10-CM | POA: Diagnosis not present

## 2023-07-20 LAB — CBC WITH DIFFERENTIAL/PLATELET
Abs Immature Granulocytes: 0.03 10*3/uL (ref 0.00–0.07)
Basophils Absolute: 0 10*3/uL (ref 0.0–0.1)
Basophils Relative: 1 %
Eosinophils Absolute: 0.2 10*3/uL (ref 0.0–0.5)
Eosinophils Relative: 4 %
HCT: 31.9 % — ABNORMAL LOW (ref 36.0–46.0)
Hemoglobin: 10.3 g/dL — ABNORMAL LOW (ref 12.0–15.0)
Immature Granulocytes: 1 %
Lymphocytes Relative: 13 %
Lymphs Abs: 0.7 10*3/uL (ref 0.7–4.0)
MCH: 33.9 pg (ref 26.0–34.0)
MCHC: 32.3 g/dL (ref 30.0–36.0)
MCV: 104.9 fL — ABNORMAL HIGH (ref 80.0–100.0)
Monocytes Absolute: 0.3 10*3/uL (ref 0.1–1.0)
Monocytes Relative: 5 %
Neutro Abs: 4.4 10*3/uL (ref 1.7–7.7)
Neutrophils Relative %: 76 %
Platelets: 199 10*3/uL (ref 150–400)
RBC: 3.04 MIL/uL — ABNORMAL LOW (ref 3.87–5.11)
RDW: 16.6 % — ABNORMAL HIGH (ref 11.5–15.5)
WBC: 5.7 10*3/uL (ref 4.0–10.5)
nRBC: 0 % (ref 0.0–0.2)

## 2023-07-20 LAB — URINALYSIS, W/ REFLEX TO CULTURE (INFECTION SUSPECTED)
Bilirubin Urine: NEGATIVE
Glucose, UA: NEGATIVE mg/dL
Hgb urine dipstick: NEGATIVE
Ketones, ur: 5 mg/dL — AB
Leukocytes,Ua: NEGATIVE
Nitrite: POSITIVE — AB
Protein, ur: NEGATIVE mg/dL
Specific Gravity, Urine: 1.024 (ref 1.005–1.030)
pH: 5 (ref 5.0–8.0)

## 2023-07-20 LAB — I-STAT CHEM 8, ED
BUN: 11 mg/dL (ref 8–23)
Calcium, Ion: 1.1 mmol/L — ABNORMAL LOW (ref 1.15–1.40)
Chloride: 104 mmol/L (ref 98–111)
Creatinine, Ser: 0.7 mg/dL (ref 0.44–1.00)
Glucose, Bld: 127 mg/dL — ABNORMAL HIGH (ref 70–99)
HCT: 33 % — ABNORMAL LOW (ref 36.0–46.0)
Hemoglobin: 11.2 g/dL — ABNORMAL LOW (ref 12.0–15.0)
Potassium: 3.9 mmol/L (ref 3.5–5.1)
Sodium: 140 mmol/L (ref 135–145)
TCO2: 22 mmol/L (ref 22–32)

## 2023-07-20 LAB — COMPREHENSIVE METABOLIC PANEL
ALT: 8 U/L (ref 0–44)
AST: 16 U/L (ref 15–41)
Albumin: 2.9 g/dL — ABNORMAL LOW (ref 3.5–5.0)
Alkaline Phosphatase: 44 U/L (ref 38–126)
Anion gap: 10 (ref 5–15)
BUN: 10 mg/dL (ref 8–23)
CO2: 23 mmol/L (ref 22–32)
Calcium: 8.6 mg/dL — ABNORMAL LOW (ref 8.9–10.3)
Chloride: 105 mmol/L (ref 98–111)
Creatinine, Ser: 0.74 mg/dL (ref 0.44–1.00)
GFR, Estimated: >60 mL/min (ref >60)
Glucose, Bld: 127 mg/dL — ABNORMAL HIGH (ref 70–99)
Potassium: 3.9 mmol/L (ref 3.5–5.1)
Sodium: 138 mmol/L (ref 135–145)
Total Bilirubin: 0.8 mg/dL (ref 0.3–1.2)
Total Protein: 6.5 g/dL (ref 6.5–8.1)

## 2023-07-20 LAB — PROTIME-INR
INR: 1.3 — ABNORMAL HIGH (ref 0.8–1.2)
Prothrombin Time: 16 s — ABNORMAL HIGH (ref 11.4–15.2)

## 2023-07-20 LAB — I-STAT CG4 LACTIC ACID, ED: Lactic Acid, Venous: 1.4 mmol/L (ref 0.5–1.9)

## 2023-07-20 LAB — APTT: aPTT: 30 s (ref 24–36)

## 2023-07-20 MED ORDER — OXYCODONE-ACETAMINOPHEN 5-325 MG PO TABS
1.0000 | ORAL_TABLET | ORAL | Status: DC | PRN
  Administered 2023-07-20 (×2): 1 via ORAL
  Filled 2023-07-20 (×2): qty 1

## 2023-07-20 MED ORDER — MORPHINE SULFATE (PF) 4 MG/ML IV SOLN
4.0000 mg | Freq: Once | INTRAVENOUS | Status: AC
  Administered 2023-07-20: 4 mg via INTRAVENOUS
  Filled 2023-07-20: qty 1

## 2023-07-20 MED ORDER — OXYCODONE-ACETAMINOPHEN 5-325 MG PO TABS: 1.0000 | ORAL_TABLET | Freq: Four times a day (QID) | ORAL | 0 refills | Status: AC | PRN

## 2023-07-20 MED ORDER — SILVER SULFADIAZINE 1 % EX CREA: 1.0000 | TOPICAL_CREAM | Freq: Every day | CUTANEOUS | 0 refills | Status: AC

## 2023-07-20 MED ORDER — SILVER SULFADIAZINE 1 % EX CREA
TOPICAL_CREAM | Freq: Once | CUTANEOUS | Status: AC
  Administered 2023-07-20: 1 via TOPICAL
  Filled 2023-07-20: qty 85

## 2023-07-20 MED ORDER — OXYCODONE-ACETAMINOPHEN 5-325 MG PO TABS: 1.0000 | ORAL_TABLET | ORAL | 0 refills | Status: AC | PRN

## 2023-07-20 MED ORDER — SILVER SULFADIAZINE 1 % EX CREA: 1.0000 | TOPICAL_CREAM | Freq: Two times a day (BID) | CUTANEOUS | 0 refills | Status: AC

## 2023-07-20 MED ORDER — ONDANSETRON HCL 4 MG/2ML IJ SOLN
4.0000 mg | Freq: Once | INTRAMUSCULAR | Status: AC
  Administered 2023-07-20: 4 mg via INTRAVENOUS
  Filled 2023-07-20: qty 2

## 2023-07-20 NOTE — ED Notes (Signed)
PTAR has been scheduled for the patient.

## 2023-07-22 DIAGNOSIS — Z7401 Bed confinement status: Secondary | ICD-10-CM | POA: Diagnosis not present

## 2023-07-22 DIAGNOSIS — R609 Edema, unspecified: Secondary | ICD-10-CM | POA: Diagnosis not present

## 2023-07-22 DIAGNOSIS — Z743 Need for continuous supervision: Secondary | ICD-10-CM | POA: Diagnosis not present

## 2023-07-22 DIAGNOSIS — W19XXXA Unspecified fall, initial encounter: Secondary | ICD-10-CM | POA: Diagnosis not present

## 2023-07-22 DIAGNOSIS — M79606 Pain in leg, unspecified: Secondary | ICD-10-CM | POA: Diagnosis not present

## 2023-07-22 DIAGNOSIS — M799 Soft tissue disorder, unspecified: Secondary | ICD-10-CM | POA: Diagnosis not present

## 2023-07-22 DIAGNOSIS — M25561 Pain in right knee: Secondary | ICD-10-CM | POA: Diagnosis not present

## 2023-07-22 DIAGNOSIS — R296 Repeated falls: Secondary | ICD-10-CM | POA: Diagnosis not present

## 2023-07-22 DIAGNOSIS — M1711 Unilateral primary osteoarthritis, right knee: Secondary | ICD-10-CM | POA: Diagnosis not present

## 2023-07-25 LAB — CULTURE, BLOOD (ROUTINE X 2)
Culture: NO GROWTH
Culture: NO GROWTH
Special Requests: ADEQUATE
Special Requests: ADEQUATE

## 2023-07-31 DIAGNOSIS — Z743 Need for continuous supervision: Secondary | ICD-10-CM | POA: Diagnosis not present

## 2023-07-31 DIAGNOSIS — R531 Weakness: Secondary | ICD-10-CM | POA: Diagnosis not present

## 2023-07-31 DIAGNOSIS — S71101A Unspecified open wound, right thigh, initial encounter: Secondary | ICD-10-CM | POA: Diagnosis not present

## 2023-08-06 DIAGNOSIS — I110 Hypertensive heart disease with heart failure: Secondary | ICD-10-CM | POA: Diagnosis not present

## 2023-08-06 DIAGNOSIS — Z9181 History of falling: Secondary | ICD-10-CM | POA: Diagnosis not present

## 2023-08-06 DIAGNOSIS — I5032 Chronic diastolic (congestive) heart failure: Secondary | ICD-10-CM | POA: Diagnosis not present

## 2023-08-06 DIAGNOSIS — S71101A Unspecified open wound, right thigh, initial encounter: Secondary | ICD-10-CM | POA: Diagnosis not present

## 2023-08-06 DIAGNOSIS — M160 Bilateral primary osteoarthritis of hip: Secondary | ICD-10-CM | POA: Diagnosis not present

## 2023-08-06 DIAGNOSIS — Z87891 Personal history of nicotine dependence: Secondary | ICD-10-CM | POA: Diagnosis not present

## 2023-08-06 DIAGNOSIS — E039 Hypothyroidism, unspecified: Secondary | ICD-10-CM | POA: Diagnosis not present

## 2023-08-06 DIAGNOSIS — Z604 Social exclusion and rejection: Secondary | ICD-10-CM | POA: Diagnosis not present

## 2023-08-09 DIAGNOSIS — Z604 Social exclusion and rejection: Secondary | ICD-10-CM | POA: Diagnosis not present

## 2023-08-09 DIAGNOSIS — I110 Hypertensive heart disease with heart failure: Secondary | ICD-10-CM | POA: Diagnosis not present

## 2023-08-09 DIAGNOSIS — E039 Hypothyroidism, unspecified: Secondary | ICD-10-CM | POA: Diagnosis not present

## 2023-08-09 DIAGNOSIS — Z9181 History of falling: Secondary | ICD-10-CM | POA: Diagnosis not present

## 2023-08-09 DIAGNOSIS — Z87891 Personal history of nicotine dependence: Secondary | ICD-10-CM | POA: Diagnosis not present

## 2023-08-09 DIAGNOSIS — M160 Bilateral primary osteoarthritis of hip: Secondary | ICD-10-CM | POA: Diagnosis not present

## 2023-08-09 DIAGNOSIS — S71101A Unspecified open wound, right thigh, initial encounter: Secondary | ICD-10-CM | POA: Diagnosis not present

## 2023-08-09 DIAGNOSIS — I5032 Chronic diastolic (congestive) heart failure: Secondary | ICD-10-CM | POA: Diagnosis not present

## 2023-08-15 DIAGNOSIS — Z87891 Personal history of nicotine dependence: Secondary | ICD-10-CM | POA: Diagnosis not present

## 2023-08-15 DIAGNOSIS — E039 Hypothyroidism, unspecified: Secondary | ICD-10-CM | POA: Diagnosis not present

## 2023-08-15 DIAGNOSIS — I110 Hypertensive heart disease with heart failure: Secondary | ICD-10-CM | POA: Diagnosis not present

## 2023-08-15 DIAGNOSIS — M160 Bilateral primary osteoarthritis of hip: Secondary | ICD-10-CM | POA: Diagnosis not present

## 2023-08-15 DIAGNOSIS — S71101A Unspecified open wound, right thigh, initial encounter: Secondary | ICD-10-CM | POA: Diagnosis not present

## 2023-08-15 DIAGNOSIS — I5032 Chronic diastolic (congestive) heart failure: Secondary | ICD-10-CM | POA: Diagnosis not present

## 2023-08-15 DIAGNOSIS — Z9181 History of falling: Secondary | ICD-10-CM | POA: Diagnosis not present

## 2023-08-15 DIAGNOSIS — Z604 Social exclusion and rejection: Secondary | ICD-10-CM | POA: Diagnosis not present

## 2023-08-21 DIAGNOSIS — S71101A Unspecified open wound, right thigh, initial encounter: Secondary | ICD-10-CM | POA: Diagnosis not present

## 2023-08-21 DIAGNOSIS — I110 Hypertensive heart disease with heart failure: Secondary | ICD-10-CM | POA: Diagnosis not present

## 2023-08-21 DIAGNOSIS — I5032 Chronic diastolic (congestive) heart failure: Secondary | ICD-10-CM | POA: Diagnosis not present

## 2023-08-21 DIAGNOSIS — Z604 Social exclusion and rejection: Secondary | ICD-10-CM | POA: Diagnosis not present

## 2023-08-21 DIAGNOSIS — Z9181 History of falling: Secondary | ICD-10-CM | POA: Diagnosis not present

## 2023-08-21 DIAGNOSIS — E039 Hypothyroidism, unspecified: Secondary | ICD-10-CM | POA: Diagnosis not present

## 2023-08-21 DIAGNOSIS — M160 Bilateral primary osteoarthritis of hip: Secondary | ICD-10-CM | POA: Diagnosis not present

## 2023-08-21 DIAGNOSIS — Z87891 Personal history of nicotine dependence: Secondary | ICD-10-CM | POA: Diagnosis not present

## 2023-08-29 DIAGNOSIS — Z742 Need for assistance at home and no other household member able to render care: Secondary | ICD-10-CM | POA: Diagnosis not present

## 2023-08-29 DIAGNOSIS — Z1389 Encounter for screening for other disorder: Secondary | ICD-10-CM | POA: Diagnosis not present

## 2023-08-29 DIAGNOSIS — I10 Essential (primary) hypertension: Secondary | ICD-10-CM | POA: Diagnosis not present

## 2023-08-29 DIAGNOSIS — Z743 Need for continuous supervision: Secondary | ICD-10-CM | POA: Diagnosis not present

## 2023-08-29 DIAGNOSIS — Z7401 Bed confinement status: Secondary | ICD-10-CM | POA: Diagnosis not present

## 2023-08-29 DIAGNOSIS — E039 Hypothyroidism, unspecified: Secondary | ICD-10-CM | POA: Diagnosis not present

## 2023-08-29 DIAGNOSIS — I5032 Chronic diastolic (congestive) heart failure: Secondary | ICD-10-CM | POA: Diagnosis not present

## 2023-08-29 DIAGNOSIS — Z23 Encounter for immunization: Secondary | ICD-10-CM | POA: Diagnosis not present

## 2023-08-29 DIAGNOSIS — R7301 Impaired fasting glucose: Secondary | ICD-10-CM | POA: Diagnosis not present

## 2023-08-29 DIAGNOSIS — Z0000 Encounter for general adult medical examination without abnormal findings: Secondary | ICD-10-CM | POA: Diagnosis not present

## 2023-08-29 DIAGNOSIS — E7800 Pure hypercholesterolemia, unspecified: Secondary | ICD-10-CM | POA: Diagnosis not present

## 2023-08-29 DIAGNOSIS — R531 Weakness: Secondary | ICD-10-CM | POA: Diagnosis not present

## 2023-08-30 DIAGNOSIS — Z9181 History of falling: Secondary | ICD-10-CM | POA: Diagnosis not present

## 2023-08-30 DIAGNOSIS — Z87891 Personal history of nicotine dependence: Secondary | ICD-10-CM | POA: Diagnosis not present

## 2023-08-30 DIAGNOSIS — I5032 Chronic diastolic (congestive) heart failure: Secondary | ICD-10-CM | POA: Diagnosis not present

## 2023-08-30 DIAGNOSIS — I110 Hypertensive heart disease with heart failure: Secondary | ICD-10-CM | POA: Diagnosis not present

## 2023-08-30 DIAGNOSIS — M160 Bilateral primary osteoarthritis of hip: Secondary | ICD-10-CM | POA: Diagnosis not present

## 2023-08-30 DIAGNOSIS — E039 Hypothyroidism, unspecified: Secondary | ICD-10-CM | POA: Diagnosis not present

## 2023-08-30 DIAGNOSIS — Z604 Social exclusion and rejection: Secondary | ICD-10-CM | POA: Diagnosis not present

## 2023-08-30 DIAGNOSIS — S71101A Unspecified open wound, right thigh, initial encounter: Secondary | ICD-10-CM | POA: Diagnosis not present

## 2023-09-04 DIAGNOSIS — Z604 Social exclusion and rejection: Secondary | ICD-10-CM | POA: Diagnosis not present

## 2023-09-04 DIAGNOSIS — S71101A Unspecified open wound, right thigh, initial encounter: Secondary | ICD-10-CM | POA: Diagnosis not present

## 2023-09-04 DIAGNOSIS — M160 Bilateral primary osteoarthritis of hip: Secondary | ICD-10-CM | POA: Diagnosis not present

## 2023-09-04 DIAGNOSIS — Z87891 Personal history of nicotine dependence: Secondary | ICD-10-CM | POA: Diagnosis not present

## 2023-09-04 DIAGNOSIS — I5032 Chronic diastolic (congestive) heart failure: Secondary | ICD-10-CM | POA: Diagnosis not present

## 2023-09-04 DIAGNOSIS — Z9181 History of falling: Secondary | ICD-10-CM | POA: Diagnosis not present

## 2023-09-04 DIAGNOSIS — E039 Hypothyroidism, unspecified: Secondary | ICD-10-CM | POA: Diagnosis not present

## 2023-09-04 DIAGNOSIS — I110 Hypertensive heart disease with heart failure: Secondary | ICD-10-CM | POA: Diagnosis not present

## 2023-09-06 DIAGNOSIS — R531 Weakness: Secondary | ICD-10-CM | POA: Diagnosis not present

## 2023-09-06 DIAGNOSIS — Z743 Need for continuous supervision: Secondary | ICD-10-CM | POA: Diagnosis not present

## 2023-09-09 DIAGNOSIS — Z604 Social exclusion and rejection: Secondary | ICD-10-CM | POA: Diagnosis not present

## 2023-09-09 DIAGNOSIS — E039 Hypothyroidism, unspecified: Secondary | ICD-10-CM | POA: Diagnosis not present

## 2023-09-09 DIAGNOSIS — S71101A Unspecified open wound, right thigh, initial encounter: Secondary | ICD-10-CM | POA: Diagnosis not present

## 2023-09-09 DIAGNOSIS — M160 Bilateral primary osteoarthritis of hip: Secondary | ICD-10-CM | POA: Diagnosis not present

## 2023-09-09 DIAGNOSIS — Z9181 History of falling: Secondary | ICD-10-CM | POA: Diagnosis not present

## 2023-09-09 DIAGNOSIS — Z87891 Personal history of nicotine dependence: Secondary | ICD-10-CM | POA: Diagnosis not present

## 2023-09-09 DIAGNOSIS — I110 Hypertensive heart disease with heart failure: Secondary | ICD-10-CM | POA: Diagnosis not present

## 2023-09-09 DIAGNOSIS — I5032 Chronic diastolic (congestive) heart failure: Secondary | ICD-10-CM | POA: Diagnosis not present

## 2023-09-10 DIAGNOSIS — M160 Bilateral primary osteoarthritis of hip: Secondary | ICD-10-CM | POA: Diagnosis not present

## 2023-09-10 DIAGNOSIS — Z87891 Personal history of nicotine dependence: Secondary | ICD-10-CM | POA: Diagnosis not present

## 2023-09-10 DIAGNOSIS — Z9181 History of falling: Secondary | ICD-10-CM | POA: Diagnosis not present

## 2023-09-10 DIAGNOSIS — E039 Hypothyroidism, unspecified: Secondary | ICD-10-CM | POA: Diagnosis not present

## 2023-09-10 DIAGNOSIS — I110 Hypertensive heart disease with heart failure: Secondary | ICD-10-CM | POA: Diagnosis not present

## 2023-09-10 DIAGNOSIS — S71101A Unspecified open wound, right thigh, initial encounter: Secondary | ICD-10-CM | POA: Diagnosis not present

## 2023-09-10 DIAGNOSIS — I5032 Chronic diastolic (congestive) heart failure: Secondary | ICD-10-CM | POA: Diagnosis not present

## 2023-09-10 DIAGNOSIS — Z604 Social exclusion and rejection: Secondary | ICD-10-CM | POA: Diagnosis not present

## 2023-09-11 DIAGNOSIS — M160 Bilateral primary osteoarthritis of hip: Secondary | ICD-10-CM | POA: Diagnosis not present

## 2023-09-11 DIAGNOSIS — I5032 Chronic diastolic (congestive) heart failure: Secondary | ICD-10-CM | POA: Diagnosis not present

## 2023-09-11 DIAGNOSIS — Z604 Social exclusion and rejection: Secondary | ICD-10-CM | POA: Diagnosis not present

## 2023-09-11 DIAGNOSIS — Z9181 History of falling: Secondary | ICD-10-CM | POA: Diagnosis not present

## 2023-09-11 DIAGNOSIS — Z87891 Personal history of nicotine dependence: Secondary | ICD-10-CM | POA: Diagnosis not present

## 2023-09-11 DIAGNOSIS — S71101A Unspecified open wound, right thigh, initial encounter: Secondary | ICD-10-CM | POA: Diagnosis not present

## 2023-09-11 DIAGNOSIS — I110 Hypertensive heart disease with heart failure: Secondary | ICD-10-CM | POA: Diagnosis not present

## 2023-09-11 DIAGNOSIS — E039 Hypothyroidism, unspecified: Secondary | ICD-10-CM | POA: Diagnosis not present

## 2023-09-12 DIAGNOSIS — I110 Hypertensive heart disease with heart failure: Secondary | ICD-10-CM | POA: Diagnosis not present

## 2023-09-12 DIAGNOSIS — E039 Hypothyroidism, unspecified: Secondary | ICD-10-CM | POA: Diagnosis not present

## 2023-09-12 DIAGNOSIS — S71101A Unspecified open wound, right thigh, initial encounter: Secondary | ICD-10-CM | POA: Diagnosis not present

## 2023-09-12 DIAGNOSIS — I5032 Chronic diastolic (congestive) heart failure: Secondary | ICD-10-CM | POA: Diagnosis not present

## 2023-09-12 DIAGNOSIS — M160 Bilateral primary osteoarthritis of hip: Secondary | ICD-10-CM | POA: Diagnosis not present

## 2023-09-12 DIAGNOSIS — Z604 Social exclusion and rejection: Secondary | ICD-10-CM | POA: Diagnosis not present

## 2023-09-12 DIAGNOSIS — Z87891 Personal history of nicotine dependence: Secondary | ICD-10-CM | POA: Diagnosis not present

## 2023-09-12 DIAGNOSIS — Z9181 History of falling: Secondary | ICD-10-CM | POA: Diagnosis not present

## 2023-09-13 DIAGNOSIS — S71101A Unspecified open wound, right thigh, initial encounter: Secondary | ICD-10-CM | POA: Diagnosis not present

## 2023-09-13 DIAGNOSIS — I5032 Chronic diastolic (congestive) heart failure: Secondary | ICD-10-CM | POA: Diagnosis not present

## 2023-09-13 DIAGNOSIS — Z87891 Personal history of nicotine dependence: Secondary | ICD-10-CM | POA: Diagnosis not present

## 2023-09-13 DIAGNOSIS — Z9181 History of falling: Secondary | ICD-10-CM | POA: Diagnosis not present

## 2023-09-13 DIAGNOSIS — M160 Bilateral primary osteoarthritis of hip: Secondary | ICD-10-CM | POA: Diagnosis not present

## 2023-09-13 DIAGNOSIS — I110 Hypertensive heart disease with heart failure: Secondary | ICD-10-CM | POA: Diagnosis not present

## 2023-09-13 DIAGNOSIS — Z604 Social exclusion and rejection: Secondary | ICD-10-CM | POA: Diagnosis not present

## 2023-09-13 DIAGNOSIS — E039 Hypothyroidism, unspecified: Secondary | ICD-10-CM | POA: Diagnosis not present

## 2023-09-16 DIAGNOSIS — Z87891 Personal history of nicotine dependence: Secondary | ICD-10-CM | POA: Diagnosis not present

## 2023-09-16 DIAGNOSIS — M160 Bilateral primary osteoarthritis of hip: Secondary | ICD-10-CM | POA: Diagnosis not present

## 2023-09-16 DIAGNOSIS — Z604 Social exclusion and rejection: Secondary | ICD-10-CM | POA: Diagnosis not present

## 2023-09-16 DIAGNOSIS — S71101A Unspecified open wound, right thigh, initial encounter: Secondary | ICD-10-CM | POA: Diagnosis not present

## 2023-09-16 DIAGNOSIS — Z9181 History of falling: Secondary | ICD-10-CM | POA: Diagnosis not present

## 2023-09-16 DIAGNOSIS — I110 Hypertensive heart disease with heart failure: Secondary | ICD-10-CM | POA: Diagnosis not present

## 2023-09-16 DIAGNOSIS — E039 Hypothyroidism, unspecified: Secondary | ICD-10-CM | POA: Diagnosis not present

## 2023-09-16 DIAGNOSIS — I5032 Chronic diastolic (congestive) heart failure: Secondary | ICD-10-CM | POA: Diagnosis not present

## 2023-09-17 DIAGNOSIS — Z9181 History of falling: Secondary | ICD-10-CM | POA: Diagnosis not present

## 2023-09-17 DIAGNOSIS — I5032 Chronic diastolic (congestive) heart failure: Secondary | ICD-10-CM | POA: Diagnosis not present

## 2023-09-17 DIAGNOSIS — Z604 Social exclusion and rejection: Secondary | ICD-10-CM | POA: Diagnosis not present

## 2023-09-17 DIAGNOSIS — Z87891 Personal history of nicotine dependence: Secondary | ICD-10-CM | POA: Diagnosis not present

## 2023-09-17 DIAGNOSIS — M160 Bilateral primary osteoarthritis of hip: Secondary | ICD-10-CM | POA: Diagnosis not present

## 2023-09-17 DIAGNOSIS — E039 Hypothyroidism, unspecified: Secondary | ICD-10-CM | POA: Diagnosis not present

## 2023-09-17 DIAGNOSIS — S71101A Unspecified open wound, right thigh, initial encounter: Secondary | ICD-10-CM | POA: Diagnosis not present

## 2023-09-17 DIAGNOSIS — I110 Hypertensive heart disease with heart failure: Secondary | ICD-10-CM | POA: Diagnosis not present

## 2023-09-19 DIAGNOSIS — S71101A Unspecified open wound, right thigh, initial encounter: Secondary | ICD-10-CM | POA: Diagnosis not present

## 2023-09-19 DIAGNOSIS — M160 Bilateral primary osteoarthritis of hip: Secondary | ICD-10-CM | POA: Diagnosis not present

## 2023-09-19 DIAGNOSIS — I5032 Chronic diastolic (congestive) heart failure: Secondary | ICD-10-CM | POA: Diagnosis not present

## 2023-09-19 DIAGNOSIS — Z9181 History of falling: Secondary | ICD-10-CM | POA: Diagnosis not present

## 2023-09-19 DIAGNOSIS — Z87891 Personal history of nicotine dependence: Secondary | ICD-10-CM | POA: Diagnosis not present

## 2023-09-19 DIAGNOSIS — Z604 Social exclusion and rejection: Secondary | ICD-10-CM | POA: Diagnosis not present

## 2023-09-19 DIAGNOSIS — I110 Hypertensive heart disease with heart failure: Secondary | ICD-10-CM | POA: Diagnosis not present

## 2023-09-19 DIAGNOSIS — E039 Hypothyroidism, unspecified: Secondary | ICD-10-CM | POA: Diagnosis not present

## 2023-09-20 DIAGNOSIS — I110 Hypertensive heart disease with heart failure: Secondary | ICD-10-CM | POA: Diagnosis not present

## 2023-09-20 DIAGNOSIS — E039 Hypothyroidism, unspecified: Secondary | ICD-10-CM | POA: Diagnosis not present

## 2023-09-20 DIAGNOSIS — S71101A Unspecified open wound, right thigh, initial encounter: Secondary | ICD-10-CM | POA: Diagnosis not present

## 2023-09-20 DIAGNOSIS — Z604 Social exclusion and rejection: Secondary | ICD-10-CM | POA: Diagnosis not present

## 2023-09-20 DIAGNOSIS — Z87891 Personal history of nicotine dependence: Secondary | ICD-10-CM | POA: Diagnosis not present

## 2023-09-20 DIAGNOSIS — I5032 Chronic diastolic (congestive) heart failure: Secondary | ICD-10-CM | POA: Diagnosis not present

## 2023-09-20 DIAGNOSIS — M160 Bilateral primary osteoarthritis of hip: Secondary | ICD-10-CM | POA: Diagnosis not present

## 2023-09-20 DIAGNOSIS — Z9181 History of falling: Secondary | ICD-10-CM | POA: Diagnosis not present

## 2023-09-23 DIAGNOSIS — S71101A Unspecified open wound, right thigh, initial encounter: Secondary | ICD-10-CM | POA: Diagnosis not present

## 2023-09-23 DIAGNOSIS — E039 Hypothyroidism, unspecified: Secondary | ICD-10-CM | POA: Diagnosis not present

## 2023-09-23 DIAGNOSIS — M160 Bilateral primary osteoarthritis of hip: Secondary | ICD-10-CM | POA: Diagnosis not present

## 2023-09-23 DIAGNOSIS — I5032 Chronic diastolic (congestive) heart failure: Secondary | ICD-10-CM | POA: Diagnosis not present

## 2023-09-23 DIAGNOSIS — Z9181 History of falling: Secondary | ICD-10-CM | POA: Diagnosis not present

## 2023-09-23 DIAGNOSIS — Z604 Social exclusion and rejection: Secondary | ICD-10-CM | POA: Diagnosis not present

## 2023-09-23 DIAGNOSIS — I110 Hypertensive heart disease with heart failure: Secondary | ICD-10-CM | POA: Diagnosis not present

## 2023-09-23 DIAGNOSIS — Z87891 Personal history of nicotine dependence: Secondary | ICD-10-CM | POA: Diagnosis not present

## 2023-09-24 DIAGNOSIS — S71101A Unspecified open wound, right thigh, initial encounter: Secondary | ICD-10-CM | POA: Diagnosis not present

## 2023-09-24 DIAGNOSIS — Z9181 History of falling: Secondary | ICD-10-CM | POA: Diagnosis not present

## 2023-09-24 DIAGNOSIS — Z604 Social exclusion and rejection: Secondary | ICD-10-CM | POA: Diagnosis not present

## 2023-09-24 DIAGNOSIS — I110 Hypertensive heart disease with heart failure: Secondary | ICD-10-CM | POA: Diagnosis not present

## 2023-09-24 DIAGNOSIS — M160 Bilateral primary osteoarthritis of hip: Secondary | ICD-10-CM | POA: Diagnosis not present

## 2023-09-24 DIAGNOSIS — E039 Hypothyroidism, unspecified: Secondary | ICD-10-CM | POA: Diagnosis not present

## 2023-09-24 DIAGNOSIS — Z87891 Personal history of nicotine dependence: Secondary | ICD-10-CM | POA: Diagnosis not present

## 2023-09-24 DIAGNOSIS — I5032 Chronic diastolic (congestive) heart failure: Secondary | ICD-10-CM | POA: Diagnosis not present

## 2023-09-26 DIAGNOSIS — I110 Hypertensive heart disease with heart failure: Secondary | ICD-10-CM | POA: Diagnosis not present

## 2023-09-26 DIAGNOSIS — Z604 Social exclusion and rejection: Secondary | ICD-10-CM | POA: Diagnosis not present

## 2023-09-26 DIAGNOSIS — Z87891 Personal history of nicotine dependence: Secondary | ICD-10-CM | POA: Diagnosis not present

## 2023-09-26 DIAGNOSIS — M160 Bilateral primary osteoarthritis of hip: Secondary | ICD-10-CM | POA: Diagnosis not present

## 2023-09-26 DIAGNOSIS — S71101A Unspecified open wound, right thigh, initial encounter: Secondary | ICD-10-CM | POA: Diagnosis not present

## 2023-09-26 DIAGNOSIS — E039 Hypothyroidism, unspecified: Secondary | ICD-10-CM | POA: Diagnosis not present

## 2023-09-26 DIAGNOSIS — Z9181 History of falling: Secondary | ICD-10-CM | POA: Diagnosis not present

## 2023-09-26 DIAGNOSIS — I5032 Chronic diastolic (congestive) heart failure: Secondary | ICD-10-CM | POA: Diagnosis not present

## 2023-09-27 DIAGNOSIS — M160 Bilateral primary osteoarthritis of hip: Secondary | ICD-10-CM | POA: Diagnosis not present

## 2023-09-27 DIAGNOSIS — Z87891 Personal history of nicotine dependence: Secondary | ICD-10-CM | POA: Diagnosis not present

## 2023-09-27 DIAGNOSIS — Z604 Social exclusion and rejection: Secondary | ICD-10-CM | POA: Diagnosis not present

## 2023-09-27 DIAGNOSIS — I110 Hypertensive heart disease with heart failure: Secondary | ICD-10-CM | POA: Diagnosis not present

## 2023-09-27 DIAGNOSIS — Z9181 History of falling: Secondary | ICD-10-CM | POA: Diagnosis not present

## 2023-09-27 DIAGNOSIS — E039 Hypothyroidism, unspecified: Secondary | ICD-10-CM | POA: Diagnosis not present

## 2023-09-27 DIAGNOSIS — I5032 Chronic diastolic (congestive) heart failure: Secondary | ICD-10-CM | POA: Diagnosis not present

## 2023-09-27 DIAGNOSIS — S71101A Unspecified open wound, right thigh, initial encounter: Secondary | ICD-10-CM | POA: Diagnosis not present

## 2023-09-30 DIAGNOSIS — E039 Hypothyroidism, unspecified: Secondary | ICD-10-CM | POA: Diagnosis not present

## 2023-09-30 DIAGNOSIS — S71101A Unspecified open wound, right thigh, initial encounter: Secondary | ICD-10-CM | POA: Diagnosis not present

## 2023-09-30 DIAGNOSIS — Z9181 History of falling: Secondary | ICD-10-CM | POA: Diagnosis not present

## 2023-09-30 DIAGNOSIS — I5032 Chronic diastolic (congestive) heart failure: Secondary | ICD-10-CM | POA: Diagnosis not present

## 2023-09-30 DIAGNOSIS — Z87891 Personal history of nicotine dependence: Secondary | ICD-10-CM | POA: Diagnosis not present

## 2023-09-30 DIAGNOSIS — I110 Hypertensive heart disease with heart failure: Secondary | ICD-10-CM | POA: Diagnosis not present

## 2023-09-30 DIAGNOSIS — Z604 Social exclusion and rejection: Secondary | ICD-10-CM | POA: Diagnosis not present

## 2023-09-30 DIAGNOSIS — M160 Bilateral primary osteoarthritis of hip: Secondary | ICD-10-CM | POA: Diagnosis not present

## 2023-10-01 DIAGNOSIS — Z87891 Personal history of nicotine dependence: Secondary | ICD-10-CM | POA: Diagnosis not present

## 2023-10-01 DIAGNOSIS — I5032 Chronic diastolic (congestive) heart failure: Secondary | ICD-10-CM | POA: Diagnosis not present

## 2023-10-01 DIAGNOSIS — M160 Bilateral primary osteoarthritis of hip: Secondary | ICD-10-CM | POA: Diagnosis not present

## 2023-10-01 DIAGNOSIS — Z604 Social exclusion and rejection: Secondary | ICD-10-CM | POA: Diagnosis not present

## 2023-10-01 DIAGNOSIS — S71101A Unspecified open wound, right thigh, initial encounter: Secondary | ICD-10-CM | POA: Diagnosis not present

## 2023-10-01 DIAGNOSIS — Z9181 History of falling: Secondary | ICD-10-CM | POA: Diagnosis not present

## 2023-10-01 DIAGNOSIS — I110 Hypertensive heart disease with heart failure: Secondary | ICD-10-CM | POA: Diagnosis not present

## 2023-10-01 DIAGNOSIS — E039 Hypothyroidism, unspecified: Secondary | ICD-10-CM | POA: Diagnosis not present

## 2023-10-02 DIAGNOSIS — Z604 Social exclusion and rejection: Secondary | ICD-10-CM | POA: Diagnosis not present

## 2023-10-02 DIAGNOSIS — M160 Bilateral primary osteoarthritis of hip: Secondary | ICD-10-CM | POA: Diagnosis not present

## 2023-10-02 DIAGNOSIS — I5032 Chronic diastolic (congestive) heart failure: Secondary | ICD-10-CM | POA: Diagnosis not present

## 2023-10-02 DIAGNOSIS — Z9181 History of falling: Secondary | ICD-10-CM | POA: Diagnosis not present

## 2023-10-02 DIAGNOSIS — Z87891 Personal history of nicotine dependence: Secondary | ICD-10-CM | POA: Diagnosis not present

## 2023-10-02 DIAGNOSIS — S71101A Unspecified open wound, right thigh, initial encounter: Secondary | ICD-10-CM | POA: Diagnosis not present

## 2023-10-02 DIAGNOSIS — I110 Hypertensive heart disease with heart failure: Secondary | ICD-10-CM | POA: Diagnosis not present

## 2023-10-02 DIAGNOSIS — E039 Hypothyroidism, unspecified: Secondary | ICD-10-CM | POA: Diagnosis not present

## 2023-10-03 DIAGNOSIS — Z9181 History of falling: Secondary | ICD-10-CM | POA: Diagnosis not present

## 2023-10-03 DIAGNOSIS — S71101A Unspecified open wound, right thigh, initial encounter: Secondary | ICD-10-CM | POA: Diagnosis not present

## 2023-10-03 DIAGNOSIS — M160 Bilateral primary osteoarthritis of hip: Secondary | ICD-10-CM | POA: Diagnosis not present

## 2023-10-03 DIAGNOSIS — E039 Hypothyroidism, unspecified: Secondary | ICD-10-CM | POA: Diagnosis not present

## 2023-10-03 DIAGNOSIS — Z604 Social exclusion and rejection: Secondary | ICD-10-CM | POA: Diagnosis not present

## 2023-10-03 DIAGNOSIS — I110 Hypertensive heart disease with heart failure: Secondary | ICD-10-CM | POA: Diagnosis not present

## 2023-10-03 DIAGNOSIS — I5032 Chronic diastolic (congestive) heart failure: Secondary | ICD-10-CM | POA: Diagnosis not present

## 2023-10-03 DIAGNOSIS — Z87891 Personal history of nicotine dependence: Secondary | ICD-10-CM | POA: Diagnosis not present

## 2023-10-04 DIAGNOSIS — S71101A Unspecified open wound, right thigh, initial encounter: Secondary | ICD-10-CM | POA: Diagnosis not present

## 2023-10-04 DIAGNOSIS — Z604 Social exclusion and rejection: Secondary | ICD-10-CM | POA: Diagnosis not present

## 2023-10-04 DIAGNOSIS — I110 Hypertensive heart disease with heart failure: Secondary | ICD-10-CM | POA: Diagnosis not present

## 2023-10-04 DIAGNOSIS — I5032 Chronic diastolic (congestive) heart failure: Secondary | ICD-10-CM | POA: Diagnosis not present

## 2023-10-04 DIAGNOSIS — Z87891 Personal history of nicotine dependence: Secondary | ICD-10-CM | POA: Diagnosis not present

## 2023-10-04 DIAGNOSIS — Z9181 History of falling: Secondary | ICD-10-CM | POA: Diagnosis not present

## 2023-10-04 DIAGNOSIS — E039 Hypothyroidism, unspecified: Secondary | ICD-10-CM | POA: Diagnosis not present

## 2023-10-04 DIAGNOSIS — M160 Bilateral primary osteoarthritis of hip: Secondary | ICD-10-CM | POA: Diagnosis not present

## 2023-10-07 DIAGNOSIS — E039 Hypothyroidism, unspecified: Secondary | ICD-10-CM | POA: Diagnosis not present

## 2023-10-07 DIAGNOSIS — Z87891 Personal history of nicotine dependence: Secondary | ICD-10-CM | POA: Diagnosis not present

## 2023-10-07 DIAGNOSIS — I5032 Chronic diastolic (congestive) heart failure: Secondary | ICD-10-CM | POA: Diagnosis not present

## 2023-10-07 DIAGNOSIS — Z9181 History of falling: Secondary | ICD-10-CM | POA: Diagnosis not present

## 2023-10-07 DIAGNOSIS — M160 Bilateral primary osteoarthritis of hip: Secondary | ICD-10-CM | POA: Diagnosis not present

## 2023-10-07 DIAGNOSIS — Z604 Social exclusion and rejection: Secondary | ICD-10-CM | POA: Diagnosis not present

## 2023-10-07 DIAGNOSIS — I110 Hypertensive heart disease with heart failure: Secondary | ICD-10-CM | POA: Diagnosis not present

## 2023-10-07 DIAGNOSIS — S71101A Unspecified open wound, right thigh, initial encounter: Secondary | ICD-10-CM | POA: Diagnosis not present

## 2023-10-08 DIAGNOSIS — I5032 Chronic diastolic (congestive) heart failure: Secondary | ICD-10-CM | POA: Diagnosis not present

## 2023-10-08 DIAGNOSIS — I110 Hypertensive heart disease with heart failure: Secondary | ICD-10-CM | POA: Diagnosis not present

## 2023-10-08 DIAGNOSIS — S71101A Unspecified open wound, right thigh, initial encounter: Secondary | ICD-10-CM | POA: Diagnosis not present

## 2023-10-08 DIAGNOSIS — E039 Hypothyroidism, unspecified: Secondary | ICD-10-CM | POA: Diagnosis not present

## 2023-10-08 DIAGNOSIS — Z9181 History of falling: Secondary | ICD-10-CM | POA: Diagnosis not present

## 2023-10-08 DIAGNOSIS — M160 Bilateral primary osteoarthritis of hip: Secondary | ICD-10-CM | POA: Diagnosis not present

## 2023-10-08 DIAGNOSIS — Z87891 Personal history of nicotine dependence: Secondary | ICD-10-CM | POA: Diagnosis not present

## 2023-10-08 DIAGNOSIS — Z604 Social exclusion and rejection: Secondary | ICD-10-CM | POA: Diagnosis not present

## 2023-10-09 DIAGNOSIS — E039 Hypothyroidism, unspecified: Secondary | ICD-10-CM | POA: Diagnosis not present

## 2023-10-09 DIAGNOSIS — I110 Hypertensive heart disease with heart failure: Secondary | ICD-10-CM | POA: Diagnosis not present

## 2023-10-09 DIAGNOSIS — M160 Bilateral primary osteoarthritis of hip: Secondary | ICD-10-CM | POA: Diagnosis not present

## 2023-10-09 DIAGNOSIS — S71101A Unspecified open wound, right thigh, initial encounter: Secondary | ICD-10-CM | POA: Diagnosis not present

## 2023-10-09 DIAGNOSIS — I5032 Chronic diastolic (congestive) heart failure: Secondary | ICD-10-CM | POA: Diagnosis not present

## 2023-10-09 DIAGNOSIS — Z604 Social exclusion and rejection: Secondary | ICD-10-CM | POA: Diagnosis not present

## 2023-10-09 DIAGNOSIS — Z87891 Personal history of nicotine dependence: Secondary | ICD-10-CM | POA: Diagnosis not present

## 2023-10-09 DIAGNOSIS — Z9181 History of falling: Secondary | ICD-10-CM | POA: Diagnosis not present

## 2023-10-10 DIAGNOSIS — Z604 Social exclusion and rejection: Secondary | ICD-10-CM | POA: Diagnosis not present

## 2023-10-10 DIAGNOSIS — Z87891 Personal history of nicotine dependence: Secondary | ICD-10-CM | POA: Diagnosis not present

## 2023-10-10 DIAGNOSIS — I110 Hypertensive heart disease with heart failure: Secondary | ICD-10-CM | POA: Diagnosis not present

## 2023-10-10 DIAGNOSIS — Z9181 History of falling: Secondary | ICD-10-CM | POA: Diagnosis not present

## 2023-10-10 DIAGNOSIS — M160 Bilateral primary osteoarthritis of hip: Secondary | ICD-10-CM | POA: Diagnosis not present

## 2023-10-10 DIAGNOSIS — E039 Hypothyroidism, unspecified: Secondary | ICD-10-CM | POA: Diagnosis not present

## 2023-10-10 DIAGNOSIS — S71101A Unspecified open wound, right thigh, initial encounter: Secondary | ICD-10-CM | POA: Diagnosis not present

## 2023-10-10 DIAGNOSIS — I5032 Chronic diastolic (congestive) heart failure: Secondary | ICD-10-CM | POA: Diagnosis not present

## 2023-10-11 DIAGNOSIS — E039 Hypothyroidism, unspecified: Secondary | ICD-10-CM | POA: Diagnosis not present

## 2023-10-11 DIAGNOSIS — M160 Bilateral primary osteoarthritis of hip: Secondary | ICD-10-CM | POA: Diagnosis not present

## 2023-10-11 DIAGNOSIS — S71101A Unspecified open wound, right thigh, initial encounter: Secondary | ICD-10-CM | POA: Diagnosis not present

## 2023-10-11 DIAGNOSIS — Z604 Social exclusion and rejection: Secondary | ICD-10-CM | POA: Diagnosis not present

## 2023-10-11 DIAGNOSIS — I110 Hypertensive heart disease with heart failure: Secondary | ICD-10-CM | POA: Diagnosis not present

## 2023-10-11 DIAGNOSIS — Z9181 History of falling: Secondary | ICD-10-CM | POA: Diagnosis not present

## 2023-10-11 DIAGNOSIS — Z87891 Personal history of nicotine dependence: Secondary | ICD-10-CM | POA: Diagnosis not present

## 2023-10-11 DIAGNOSIS — I5032 Chronic diastolic (congestive) heart failure: Secondary | ICD-10-CM | POA: Diagnosis not present

## 2023-10-14 DIAGNOSIS — M160 Bilateral primary osteoarthritis of hip: Secondary | ICD-10-CM | POA: Diagnosis not present

## 2023-10-14 DIAGNOSIS — Z87891 Personal history of nicotine dependence: Secondary | ICD-10-CM | POA: Diagnosis not present

## 2023-10-14 DIAGNOSIS — Z604 Social exclusion and rejection: Secondary | ICD-10-CM | POA: Diagnosis not present

## 2023-10-14 DIAGNOSIS — E039 Hypothyroidism, unspecified: Secondary | ICD-10-CM | POA: Diagnosis not present

## 2023-10-14 DIAGNOSIS — I5032 Chronic diastolic (congestive) heart failure: Secondary | ICD-10-CM | POA: Diagnosis not present

## 2023-10-14 DIAGNOSIS — S71101A Unspecified open wound, right thigh, initial encounter: Secondary | ICD-10-CM | POA: Diagnosis not present

## 2023-10-14 DIAGNOSIS — Z9181 History of falling: Secondary | ICD-10-CM | POA: Diagnosis not present

## 2023-10-14 DIAGNOSIS — I110 Hypertensive heart disease with heart failure: Secondary | ICD-10-CM | POA: Diagnosis not present

## 2023-10-15 DIAGNOSIS — Z604 Social exclusion and rejection: Secondary | ICD-10-CM | POA: Diagnosis not present

## 2023-10-15 DIAGNOSIS — S71101A Unspecified open wound, right thigh, initial encounter: Secondary | ICD-10-CM | POA: Diagnosis not present

## 2023-10-15 DIAGNOSIS — Z9181 History of falling: Secondary | ICD-10-CM | POA: Diagnosis not present

## 2023-10-15 DIAGNOSIS — E039 Hypothyroidism, unspecified: Secondary | ICD-10-CM | POA: Diagnosis not present

## 2023-10-15 DIAGNOSIS — M160 Bilateral primary osteoarthritis of hip: Secondary | ICD-10-CM | POA: Diagnosis not present

## 2023-10-15 DIAGNOSIS — I110 Hypertensive heart disease with heart failure: Secondary | ICD-10-CM | POA: Diagnosis not present

## 2023-10-15 DIAGNOSIS — Z87891 Personal history of nicotine dependence: Secondary | ICD-10-CM | POA: Diagnosis not present

## 2023-10-15 DIAGNOSIS — I5032 Chronic diastolic (congestive) heart failure: Secondary | ICD-10-CM | POA: Diagnosis not present

## 2023-10-17 DIAGNOSIS — Z604 Social exclusion and rejection: Secondary | ICD-10-CM | POA: Diagnosis not present

## 2023-10-17 DIAGNOSIS — E039 Hypothyroidism, unspecified: Secondary | ICD-10-CM | POA: Diagnosis not present

## 2023-10-17 DIAGNOSIS — S71101A Unspecified open wound, right thigh, initial encounter: Secondary | ICD-10-CM | POA: Diagnosis not present

## 2023-10-17 DIAGNOSIS — M160 Bilateral primary osteoarthritis of hip: Secondary | ICD-10-CM | POA: Diagnosis not present

## 2023-10-17 DIAGNOSIS — I5032 Chronic diastolic (congestive) heart failure: Secondary | ICD-10-CM | POA: Diagnosis not present

## 2023-10-17 DIAGNOSIS — I110 Hypertensive heart disease with heart failure: Secondary | ICD-10-CM | POA: Diagnosis not present

## 2023-10-17 DIAGNOSIS — Z9181 History of falling: Secondary | ICD-10-CM | POA: Diagnosis not present

## 2023-10-17 DIAGNOSIS — Z87891 Personal history of nicotine dependence: Secondary | ICD-10-CM | POA: Diagnosis not present

## 2023-10-18 DIAGNOSIS — Z9181 History of falling: Secondary | ICD-10-CM | POA: Diagnosis not present

## 2023-10-18 DIAGNOSIS — M160 Bilateral primary osteoarthritis of hip: Secondary | ICD-10-CM | POA: Diagnosis not present

## 2023-10-18 DIAGNOSIS — Z87891 Personal history of nicotine dependence: Secondary | ICD-10-CM | POA: Diagnosis not present

## 2023-10-18 DIAGNOSIS — S71101A Unspecified open wound, right thigh, initial encounter: Secondary | ICD-10-CM | POA: Diagnosis not present

## 2023-10-18 DIAGNOSIS — E039 Hypothyroidism, unspecified: Secondary | ICD-10-CM | POA: Diagnosis not present

## 2023-10-18 DIAGNOSIS — I5032 Chronic diastolic (congestive) heart failure: Secondary | ICD-10-CM | POA: Diagnosis not present

## 2023-10-18 DIAGNOSIS — I110 Hypertensive heart disease with heart failure: Secondary | ICD-10-CM | POA: Diagnosis not present

## 2023-10-18 DIAGNOSIS — Z604 Social exclusion and rejection: Secondary | ICD-10-CM | POA: Diagnosis not present

## 2023-10-21 DIAGNOSIS — Z9181 History of falling: Secondary | ICD-10-CM | POA: Diagnosis not present

## 2023-10-21 DIAGNOSIS — S71101A Unspecified open wound, right thigh, initial encounter: Secondary | ICD-10-CM | POA: Diagnosis not present

## 2023-10-21 DIAGNOSIS — E039 Hypothyroidism, unspecified: Secondary | ICD-10-CM | POA: Diagnosis not present

## 2023-10-21 DIAGNOSIS — M160 Bilateral primary osteoarthritis of hip: Secondary | ICD-10-CM | POA: Diagnosis not present

## 2023-10-21 DIAGNOSIS — Z604 Social exclusion and rejection: Secondary | ICD-10-CM | POA: Diagnosis not present

## 2023-10-21 DIAGNOSIS — I5032 Chronic diastolic (congestive) heart failure: Secondary | ICD-10-CM | POA: Diagnosis not present

## 2023-10-21 DIAGNOSIS — I110 Hypertensive heart disease with heart failure: Secondary | ICD-10-CM | POA: Diagnosis not present

## 2023-10-21 DIAGNOSIS — Z87891 Personal history of nicotine dependence: Secondary | ICD-10-CM | POA: Diagnosis not present

## 2023-10-22 DIAGNOSIS — E039 Hypothyroidism, unspecified: Secondary | ICD-10-CM | POA: Diagnosis not present

## 2023-10-22 DIAGNOSIS — M160 Bilateral primary osteoarthritis of hip: Secondary | ICD-10-CM | POA: Diagnosis not present

## 2023-10-22 DIAGNOSIS — S71101A Unspecified open wound, right thigh, initial encounter: Secondary | ICD-10-CM | POA: Diagnosis not present

## 2023-10-22 DIAGNOSIS — Z604 Social exclusion and rejection: Secondary | ICD-10-CM | POA: Diagnosis not present

## 2023-10-22 DIAGNOSIS — Z87891 Personal history of nicotine dependence: Secondary | ICD-10-CM | POA: Diagnosis not present

## 2023-10-22 DIAGNOSIS — I110 Hypertensive heart disease with heart failure: Secondary | ICD-10-CM | POA: Diagnosis not present

## 2023-10-22 DIAGNOSIS — I5032 Chronic diastolic (congestive) heart failure: Secondary | ICD-10-CM | POA: Diagnosis not present

## 2023-10-22 DIAGNOSIS — Z9181 History of falling: Secondary | ICD-10-CM | POA: Diagnosis not present

## 2023-10-23 DIAGNOSIS — I110 Hypertensive heart disease with heart failure: Secondary | ICD-10-CM | POA: Diagnosis not present

## 2023-10-23 DIAGNOSIS — S71101A Unspecified open wound, right thigh, initial encounter: Secondary | ICD-10-CM | POA: Diagnosis not present

## 2023-10-23 DIAGNOSIS — E039 Hypothyroidism, unspecified: Secondary | ICD-10-CM | POA: Diagnosis not present

## 2023-10-23 DIAGNOSIS — I5032 Chronic diastolic (congestive) heart failure: Secondary | ICD-10-CM | POA: Diagnosis not present

## 2023-10-23 DIAGNOSIS — Z604 Social exclusion and rejection: Secondary | ICD-10-CM | POA: Diagnosis not present

## 2023-10-23 DIAGNOSIS — Z87891 Personal history of nicotine dependence: Secondary | ICD-10-CM | POA: Diagnosis not present

## 2023-10-23 DIAGNOSIS — M160 Bilateral primary osteoarthritis of hip: Secondary | ICD-10-CM | POA: Diagnosis not present

## 2023-10-23 DIAGNOSIS — Z9181 History of falling: Secondary | ICD-10-CM | POA: Diagnosis not present

## 2023-10-24 DIAGNOSIS — Z9181 History of falling: Secondary | ICD-10-CM | POA: Diagnosis not present

## 2023-10-24 DIAGNOSIS — E039 Hypothyroidism, unspecified: Secondary | ICD-10-CM | POA: Diagnosis not present

## 2023-10-24 DIAGNOSIS — S71101A Unspecified open wound, right thigh, initial encounter: Secondary | ICD-10-CM | POA: Diagnosis not present

## 2023-10-24 DIAGNOSIS — I110 Hypertensive heart disease with heart failure: Secondary | ICD-10-CM | POA: Diagnosis not present

## 2023-10-24 DIAGNOSIS — Z87891 Personal history of nicotine dependence: Secondary | ICD-10-CM | POA: Diagnosis not present

## 2023-10-24 DIAGNOSIS — Z604 Social exclusion and rejection: Secondary | ICD-10-CM | POA: Diagnosis not present

## 2023-10-24 DIAGNOSIS — M160 Bilateral primary osteoarthritis of hip: Secondary | ICD-10-CM | POA: Diagnosis not present

## 2023-10-24 DIAGNOSIS — I5032 Chronic diastolic (congestive) heart failure: Secondary | ICD-10-CM | POA: Diagnosis not present

## 2023-10-25 DIAGNOSIS — Z87891 Personal history of nicotine dependence: Secondary | ICD-10-CM | POA: Diagnosis not present

## 2023-10-25 DIAGNOSIS — Z9181 History of falling: Secondary | ICD-10-CM | POA: Diagnosis not present

## 2023-10-25 DIAGNOSIS — S71101A Unspecified open wound, right thigh, initial encounter: Secondary | ICD-10-CM | POA: Diagnosis not present

## 2023-10-25 DIAGNOSIS — I5032 Chronic diastolic (congestive) heart failure: Secondary | ICD-10-CM | POA: Diagnosis not present

## 2023-10-25 DIAGNOSIS — E039 Hypothyroidism, unspecified: Secondary | ICD-10-CM | POA: Diagnosis not present

## 2023-10-25 DIAGNOSIS — I110 Hypertensive heart disease with heart failure: Secondary | ICD-10-CM | POA: Diagnosis not present

## 2023-10-25 DIAGNOSIS — Z604 Social exclusion and rejection: Secondary | ICD-10-CM | POA: Diagnosis not present

## 2023-10-25 DIAGNOSIS — M160 Bilateral primary osteoarthritis of hip: Secondary | ICD-10-CM | POA: Diagnosis not present

## 2023-10-28 DIAGNOSIS — I5032 Chronic diastolic (congestive) heart failure: Secondary | ICD-10-CM | POA: Diagnosis not present

## 2023-10-28 DIAGNOSIS — Z87891 Personal history of nicotine dependence: Secondary | ICD-10-CM | POA: Diagnosis not present

## 2023-10-28 DIAGNOSIS — S71101A Unspecified open wound, right thigh, initial encounter: Secondary | ICD-10-CM | POA: Diagnosis not present

## 2023-10-28 DIAGNOSIS — M160 Bilateral primary osteoarthritis of hip: Secondary | ICD-10-CM | POA: Diagnosis not present

## 2023-10-28 DIAGNOSIS — E039 Hypothyroidism, unspecified: Secondary | ICD-10-CM | POA: Diagnosis not present

## 2023-10-28 DIAGNOSIS — I110 Hypertensive heart disease with heart failure: Secondary | ICD-10-CM | POA: Diagnosis not present

## 2023-10-28 DIAGNOSIS — Z604 Social exclusion and rejection: Secondary | ICD-10-CM | POA: Diagnosis not present

## 2023-10-28 DIAGNOSIS — Z9181 History of falling: Secondary | ICD-10-CM | POA: Diagnosis not present

## 2023-10-29 DIAGNOSIS — Z87891 Personal history of nicotine dependence: Secondary | ICD-10-CM | POA: Diagnosis not present

## 2023-10-29 DIAGNOSIS — I5032 Chronic diastolic (congestive) heart failure: Secondary | ICD-10-CM | POA: Diagnosis not present

## 2023-10-29 DIAGNOSIS — Z604 Social exclusion and rejection: Secondary | ICD-10-CM | POA: Diagnosis not present

## 2023-10-29 DIAGNOSIS — S71101A Unspecified open wound, right thigh, initial encounter: Secondary | ICD-10-CM | POA: Diagnosis not present

## 2023-10-29 DIAGNOSIS — M160 Bilateral primary osteoarthritis of hip: Secondary | ICD-10-CM | POA: Diagnosis not present

## 2023-10-29 DIAGNOSIS — E039 Hypothyroidism, unspecified: Secondary | ICD-10-CM | POA: Diagnosis not present

## 2023-10-29 DIAGNOSIS — Z9181 History of falling: Secondary | ICD-10-CM | POA: Diagnosis not present

## 2023-10-29 DIAGNOSIS — I110 Hypertensive heart disease with heart failure: Secondary | ICD-10-CM | POA: Diagnosis not present

## 2023-10-30 DIAGNOSIS — Z87891 Personal history of nicotine dependence: Secondary | ICD-10-CM | POA: Diagnosis not present

## 2023-10-30 DIAGNOSIS — Z604 Social exclusion and rejection: Secondary | ICD-10-CM | POA: Diagnosis not present

## 2023-10-30 DIAGNOSIS — S71101A Unspecified open wound, right thigh, initial encounter: Secondary | ICD-10-CM | POA: Diagnosis not present

## 2023-10-30 DIAGNOSIS — I5032 Chronic diastolic (congestive) heart failure: Secondary | ICD-10-CM | POA: Diagnosis not present

## 2023-10-30 DIAGNOSIS — E039 Hypothyroidism, unspecified: Secondary | ICD-10-CM | POA: Diagnosis not present

## 2023-10-30 DIAGNOSIS — M160 Bilateral primary osteoarthritis of hip: Secondary | ICD-10-CM | POA: Diagnosis not present

## 2023-10-30 DIAGNOSIS — I110 Hypertensive heart disease with heart failure: Secondary | ICD-10-CM | POA: Diagnosis not present

## 2023-10-30 DIAGNOSIS — Z9181 History of falling: Secondary | ICD-10-CM | POA: Diagnosis not present

## 2023-10-31 DIAGNOSIS — Z9181 History of falling: Secondary | ICD-10-CM | POA: Diagnosis not present

## 2023-10-31 DIAGNOSIS — M160 Bilateral primary osteoarthritis of hip: Secondary | ICD-10-CM | POA: Diagnosis not present

## 2023-10-31 DIAGNOSIS — Z604 Social exclusion and rejection: Secondary | ICD-10-CM | POA: Diagnosis not present

## 2023-10-31 DIAGNOSIS — I5032 Chronic diastolic (congestive) heart failure: Secondary | ICD-10-CM | POA: Diagnosis not present

## 2023-10-31 DIAGNOSIS — S71101A Unspecified open wound, right thigh, initial encounter: Secondary | ICD-10-CM | POA: Diagnosis not present

## 2023-10-31 DIAGNOSIS — E039 Hypothyroidism, unspecified: Secondary | ICD-10-CM | POA: Diagnosis not present

## 2023-10-31 DIAGNOSIS — I110 Hypertensive heart disease with heart failure: Secondary | ICD-10-CM | POA: Diagnosis not present

## 2023-10-31 DIAGNOSIS — Z87891 Personal history of nicotine dependence: Secondary | ICD-10-CM | POA: Diagnosis not present

## 2023-11-01 DIAGNOSIS — M160 Bilateral primary osteoarthritis of hip: Secondary | ICD-10-CM | POA: Diagnosis not present

## 2023-11-01 DIAGNOSIS — E039 Hypothyroidism, unspecified: Secondary | ICD-10-CM | POA: Diagnosis not present

## 2023-11-01 DIAGNOSIS — Z604 Social exclusion and rejection: Secondary | ICD-10-CM | POA: Diagnosis not present

## 2023-11-01 DIAGNOSIS — Z87891 Personal history of nicotine dependence: Secondary | ICD-10-CM | POA: Diagnosis not present

## 2023-11-01 DIAGNOSIS — Z9181 History of falling: Secondary | ICD-10-CM | POA: Diagnosis not present

## 2023-11-01 DIAGNOSIS — I110 Hypertensive heart disease with heart failure: Secondary | ICD-10-CM | POA: Diagnosis not present

## 2023-11-01 DIAGNOSIS — I5032 Chronic diastolic (congestive) heart failure: Secondary | ICD-10-CM | POA: Diagnosis not present

## 2023-11-01 DIAGNOSIS — S71101A Unspecified open wound, right thigh, initial encounter: Secondary | ICD-10-CM | POA: Diagnosis not present

## 2023-11-04 DIAGNOSIS — M160 Bilateral primary osteoarthritis of hip: Secondary | ICD-10-CM | POA: Diagnosis not present

## 2023-11-04 DIAGNOSIS — Z9181 History of falling: Secondary | ICD-10-CM | POA: Diagnosis not present

## 2023-11-04 DIAGNOSIS — E039 Hypothyroidism, unspecified: Secondary | ICD-10-CM | POA: Diagnosis not present

## 2023-11-04 DIAGNOSIS — S71101A Unspecified open wound, right thigh, initial encounter: Secondary | ICD-10-CM | POA: Diagnosis not present

## 2023-11-04 DIAGNOSIS — I110 Hypertensive heart disease with heart failure: Secondary | ICD-10-CM | POA: Diagnosis not present

## 2023-11-04 DIAGNOSIS — I5032 Chronic diastolic (congestive) heart failure: Secondary | ICD-10-CM | POA: Diagnosis not present

## 2023-11-04 DIAGNOSIS — Z604 Social exclusion and rejection: Secondary | ICD-10-CM | POA: Diagnosis not present

## 2023-11-04 DIAGNOSIS — Z87891 Personal history of nicotine dependence: Secondary | ICD-10-CM | POA: Diagnosis not present

## 2023-11-05 DIAGNOSIS — E039 Hypothyroidism, unspecified: Secondary | ICD-10-CM | POA: Diagnosis not present

## 2023-11-05 DIAGNOSIS — M160 Bilateral primary osteoarthritis of hip: Secondary | ICD-10-CM | POA: Diagnosis not present

## 2023-11-05 DIAGNOSIS — I110 Hypertensive heart disease with heart failure: Secondary | ICD-10-CM | POA: Diagnosis not present

## 2023-11-05 DIAGNOSIS — S71101A Unspecified open wound, right thigh, initial encounter: Secondary | ICD-10-CM | POA: Diagnosis not present

## 2023-11-05 DIAGNOSIS — Z87891 Personal history of nicotine dependence: Secondary | ICD-10-CM | POA: Diagnosis not present

## 2023-11-05 DIAGNOSIS — Z9181 History of falling: Secondary | ICD-10-CM | POA: Diagnosis not present

## 2023-11-05 DIAGNOSIS — Z604 Social exclusion and rejection: Secondary | ICD-10-CM | POA: Diagnosis not present

## 2023-11-05 DIAGNOSIS — I5032 Chronic diastolic (congestive) heart failure: Secondary | ICD-10-CM | POA: Diagnosis not present

## 2023-11-06 DIAGNOSIS — E039 Hypothyroidism, unspecified: Secondary | ICD-10-CM | POA: Diagnosis not present

## 2023-11-06 DIAGNOSIS — S71101A Unspecified open wound, right thigh, initial encounter: Secondary | ICD-10-CM | POA: Diagnosis not present

## 2023-11-06 DIAGNOSIS — M160 Bilateral primary osteoarthritis of hip: Secondary | ICD-10-CM | POA: Diagnosis not present

## 2023-11-06 DIAGNOSIS — Z604 Social exclusion and rejection: Secondary | ICD-10-CM | POA: Diagnosis not present

## 2023-11-06 DIAGNOSIS — I110 Hypertensive heart disease with heart failure: Secondary | ICD-10-CM | POA: Diagnosis not present

## 2023-11-06 DIAGNOSIS — Z87891 Personal history of nicotine dependence: Secondary | ICD-10-CM | POA: Diagnosis not present

## 2023-11-06 DIAGNOSIS — I5032 Chronic diastolic (congestive) heart failure: Secondary | ICD-10-CM | POA: Diagnosis not present

## 2023-11-06 DIAGNOSIS — Z9181 History of falling: Secondary | ICD-10-CM | POA: Diagnosis not present

## 2023-11-07 DIAGNOSIS — Z9181 History of falling: Secondary | ICD-10-CM | POA: Diagnosis not present

## 2023-11-07 DIAGNOSIS — I5032 Chronic diastolic (congestive) heart failure: Secondary | ICD-10-CM | POA: Diagnosis not present

## 2023-11-07 DIAGNOSIS — M160 Bilateral primary osteoarthritis of hip: Secondary | ICD-10-CM | POA: Diagnosis not present

## 2023-11-07 DIAGNOSIS — E039 Hypothyroidism, unspecified: Secondary | ICD-10-CM | POA: Diagnosis not present

## 2023-11-07 DIAGNOSIS — Z604 Social exclusion and rejection: Secondary | ICD-10-CM | POA: Diagnosis not present

## 2023-11-07 DIAGNOSIS — I110 Hypertensive heart disease with heart failure: Secondary | ICD-10-CM | POA: Diagnosis not present

## 2023-11-07 DIAGNOSIS — S71101A Unspecified open wound, right thigh, initial encounter: Secondary | ICD-10-CM | POA: Diagnosis not present

## 2023-11-07 DIAGNOSIS — Z87891 Personal history of nicotine dependence: Secondary | ICD-10-CM | POA: Diagnosis not present

## 2023-11-08 DIAGNOSIS — Z87891 Personal history of nicotine dependence: Secondary | ICD-10-CM | POA: Diagnosis not present

## 2023-11-08 DIAGNOSIS — I110 Hypertensive heart disease with heart failure: Secondary | ICD-10-CM | POA: Diagnosis not present

## 2023-11-08 DIAGNOSIS — Z9181 History of falling: Secondary | ICD-10-CM | POA: Diagnosis not present

## 2023-11-08 DIAGNOSIS — E039 Hypothyroidism, unspecified: Secondary | ICD-10-CM | POA: Diagnosis not present

## 2023-11-08 DIAGNOSIS — M160 Bilateral primary osteoarthritis of hip: Secondary | ICD-10-CM | POA: Diagnosis not present

## 2023-11-08 DIAGNOSIS — S71101A Unspecified open wound, right thigh, initial encounter: Secondary | ICD-10-CM | POA: Diagnosis not present

## 2023-11-08 DIAGNOSIS — I5032 Chronic diastolic (congestive) heart failure: Secondary | ICD-10-CM | POA: Diagnosis not present

## 2023-11-08 DIAGNOSIS — Z604 Social exclusion and rejection: Secondary | ICD-10-CM | POA: Diagnosis not present

## 2023-11-11 DIAGNOSIS — E039 Hypothyroidism, unspecified: Secondary | ICD-10-CM | POA: Diagnosis not present

## 2023-11-11 DIAGNOSIS — I5032 Chronic diastolic (congestive) heart failure: Secondary | ICD-10-CM | POA: Diagnosis not present

## 2023-11-11 DIAGNOSIS — I110 Hypertensive heart disease with heart failure: Secondary | ICD-10-CM | POA: Diagnosis not present

## 2023-11-11 DIAGNOSIS — Z604 Social exclusion and rejection: Secondary | ICD-10-CM | POA: Diagnosis not present

## 2023-11-11 DIAGNOSIS — S71101A Unspecified open wound, right thigh, initial encounter: Secondary | ICD-10-CM | POA: Diagnosis not present

## 2023-11-11 DIAGNOSIS — Z87891 Personal history of nicotine dependence: Secondary | ICD-10-CM | POA: Diagnosis not present

## 2023-11-11 DIAGNOSIS — M160 Bilateral primary osteoarthritis of hip: Secondary | ICD-10-CM | POA: Diagnosis not present

## 2023-11-11 DIAGNOSIS — Z9181 History of falling: Secondary | ICD-10-CM | POA: Diagnosis not present

## 2023-11-12 DIAGNOSIS — S71101A Unspecified open wound, right thigh, initial encounter: Secondary | ICD-10-CM | POA: Diagnosis not present

## 2023-11-12 DIAGNOSIS — Z9181 History of falling: Secondary | ICD-10-CM | POA: Diagnosis not present

## 2023-11-12 DIAGNOSIS — M160 Bilateral primary osteoarthritis of hip: Secondary | ICD-10-CM | POA: Diagnosis not present

## 2023-11-12 DIAGNOSIS — Z87891 Personal history of nicotine dependence: Secondary | ICD-10-CM | POA: Diagnosis not present

## 2023-11-12 DIAGNOSIS — E039 Hypothyroidism, unspecified: Secondary | ICD-10-CM | POA: Diagnosis not present

## 2023-11-12 DIAGNOSIS — I110 Hypertensive heart disease with heart failure: Secondary | ICD-10-CM | POA: Diagnosis not present

## 2023-11-12 DIAGNOSIS — Z604 Social exclusion and rejection: Secondary | ICD-10-CM | POA: Diagnosis not present

## 2023-11-12 DIAGNOSIS — I5032 Chronic diastolic (congestive) heart failure: Secondary | ICD-10-CM | POA: Diagnosis not present

## 2023-11-13 DIAGNOSIS — I110 Hypertensive heart disease with heart failure: Secondary | ICD-10-CM | POA: Diagnosis not present

## 2023-11-13 DIAGNOSIS — S71101A Unspecified open wound, right thigh, initial encounter: Secondary | ICD-10-CM | POA: Diagnosis not present

## 2023-11-13 DIAGNOSIS — Z604 Social exclusion and rejection: Secondary | ICD-10-CM | POA: Diagnosis not present

## 2023-11-13 DIAGNOSIS — E039 Hypothyroidism, unspecified: Secondary | ICD-10-CM | POA: Diagnosis not present

## 2023-11-13 DIAGNOSIS — M160 Bilateral primary osteoarthritis of hip: Secondary | ICD-10-CM | POA: Diagnosis not present

## 2023-11-13 DIAGNOSIS — I5032 Chronic diastolic (congestive) heart failure: Secondary | ICD-10-CM | POA: Diagnosis not present

## 2023-11-13 DIAGNOSIS — Z87891 Personal history of nicotine dependence: Secondary | ICD-10-CM | POA: Diagnosis not present

## 2023-11-13 DIAGNOSIS — Z9181 History of falling: Secondary | ICD-10-CM | POA: Diagnosis not present

## 2023-11-14 DIAGNOSIS — Z604 Social exclusion and rejection: Secondary | ICD-10-CM | POA: Diagnosis not present

## 2023-11-14 DIAGNOSIS — S71101A Unspecified open wound, right thigh, initial encounter: Secondary | ICD-10-CM | POA: Diagnosis not present

## 2023-11-14 DIAGNOSIS — I5032 Chronic diastolic (congestive) heart failure: Secondary | ICD-10-CM | POA: Diagnosis not present

## 2023-11-14 DIAGNOSIS — M160 Bilateral primary osteoarthritis of hip: Secondary | ICD-10-CM | POA: Diagnosis not present

## 2023-11-14 DIAGNOSIS — E039 Hypothyroidism, unspecified: Secondary | ICD-10-CM | POA: Diagnosis not present

## 2023-11-14 DIAGNOSIS — Z9181 History of falling: Secondary | ICD-10-CM | POA: Diagnosis not present

## 2023-11-14 DIAGNOSIS — I110 Hypertensive heart disease with heart failure: Secondary | ICD-10-CM | POA: Diagnosis not present

## 2023-11-14 DIAGNOSIS — Z87891 Personal history of nicotine dependence: Secondary | ICD-10-CM | POA: Diagnosis not present

## 2023-11-18 DIAGNOSIS — E039 Hypothyroidism, unspecified: Secondary | ICD-10-CM | POA: Diagnosis not present

## 2023-11-18 DIAGNOSIS — M160 Bilateral primary osteoarthritis of hip: Secondary | ICD-10-CM | POA: Diagnosis not present

## 2023-11-18 DIAGNOSIS — S71101A Unspecified open wound, right thigh, initial encounter: Secondary | ICD-10-CM | POA: Diagnosis not present

## 2023-11-18 DIAGNOSIS — Z604 Social exclusion and rejection: Secondary | ICD-10-CM | POA: Diagnosis not present

## 2023-11-18 DIAGNOSIS — I5032 Chronic diastolic (congestive) heart failure: Secondary | ICD-10-CM | POA: Diagnosis not present

## 2023-11-18 DIAGNOSIS — Z87891 Personal history of nicotine dependence: Secondary | ICD-10-CM | POA: Diagnosis not present

## 2023-11-18 DIAGNOSIS — Z9181 History of falling: Secondary | ICD-10-CM | POA: Diagnosis not present

## 2023-11-18 DIAGNOSIS — I110 Hypertensive heart disease with heart failure: Secondary | ICD-10-CM | POA: Diagnosis not present

## 2023-11-19 DIAGNOSIS — I110 Hypertensive heart disease with heart failure: Secondary | ICD-10-CM | POA: Diagnosis not present

## 2023-11-19 DIAGNOSIS — Z9181 History of falling: Secondary | ICD-10-CM | POA: Diagnosis not present

## 2023-11-19 DIAGNOSIS — Z87891 Personal history of nicotine dependence: Secondary | ICD-10-CM | POA: Diagnosis not present

## 2023-11-19 DIAGNOSIS — S71101A Unspecified open wound, right thigh, initial encounter: Secondary | ICD-10-CM | POA: Diagnosis not present

## 2023-11-19 DIAGNOSIS — M160 Bilateral primary osteoarthritis of hip: Secondary | ICD-10-CM | POA: Diagnosis not present

## 2023-11-19 DIAGNOSIS — E039 Hypothyroidism, unspecified: Secondary | ICD-10-CM | POA: Diagnosis not present

## 2023-11-19 DIAGNOSIS — I5032 Chronic diastolic (congestive) heart failure: Secondary | ICD-10-CM | POA: Diagnosis not present

## 2023-11-19 DIAGNOSIS — Z604 Social exclusion and rejection: Secondary | ICD-10-CM | POA: Diagnosis not present

## 2023-11-20 DIAGNOSIS — S71101A Unspecified open wound, right thigh, initial encounter: Secondary | ICD-10-CM | POA: Diagnosis not present

## 2023-11-20 DIAGNOSIS — I5032 Chronic diastolic (congestive) heart failure: Secondary | ICD-10-CM | POA: Diagnosis not present

## 2023-11-20 DIAGNOSIS — Z87891 Personal history of nicotine dependence: Secondary | ICD-10-CM | POA: Diagnosis not present

## 2023-11-20 DIAGNOSIS — Z604 Social exclusion and rejection: Secondary | ICD-10-CM | POA: Diagnosis not present

## 2023-11-20 DIAGNOSIS — Z9181 History of falling: Secondary | ICD-10-CM | POA: Diagnosis not present

## 2023-11-20 DIAGNOSIS — M160 Bilateral primary osteoarthritis of hip: Secondary | ICD-10-CM | POA: Diagnosis not present

## 2023-11-20 DIAGNOSIS — I110 Hypertensive heart disease with heart failure: Secondary | ICD-10-CM | POA: Diagnosis not present

## 2023-11-20 DIAGNOSIS — E039 Hypothyroidism, unspecified: Secondary | ICD-10-CM | POA: Diagnosis not present

## 2023-11-21 DIAGNOSIS — M160 Bilateral primary osteoarthritis of hip: Secondary | ICD-10-CM | POA: Diagnosis not present

## 2023-11-21 DIAGNOSIS — Z9181 History of falling: Secondary | ICD-10-CM | POA: Diagnosis not present

## 2023-11-21 DIAGNOSIS — I5032 Chronic diastolic (congestive) heart failure: Secondary | ICD-10-CM | POA: Diagnosis not present

## 2023-11-21 DIAGNOSIS — Z604 Social exclusion and rejection: Secondary | ICD-10-CM | POA: Diagnosis not present

## 2023-11-21 DIAGNOSIS — Z87891 Personal history of nicotine dependence: Secondary | ICD-10-CM | POA: Diagnosis not present

## 2023-11-21 DIAGNOSIS — E039 Hypothyroidism, unspecified: Secondary | ICD-10-CM | POA: Diagnosis not present

## 2023-11-21 DIAGNOSIS — S71101A Unspecified open wound, right thigh, initial encounter: Secondary | ICD-10-CM | POA: Diagnosis not present

## 2023-11-21 DIAGNOSIS — I110 Hypertensive heart disease with heart failure: Secondary | ICD-10-CM | POA: Diagnosis not present

## 2023-11-22 DIAGNOSIS — I110 Hypertensive heart disease with heart failure: Secondary | ICD-10-CM | POA: Diagnosis not present

## 2023-11-22 DIAGNOSIS — Z9181 History of falling: Secondary | ICD-10-CM | POA: Diagnosis not present

## 2023-11-22 DIAGNOSIS — I5032 Chronic diastolic (congestive) heart failure: Secondary | ICD-10-CM | POA: Diagnosis not present

## 2023-11-22 DIAGNOSIS — Z604 Social exclusion and rejection: Secondary | ICD-10-CM | POA: Diagnosis not present

## 2023-11-22 DIAGNOSIS — E039 Hypothyroidism, unspecified: Secondary | ICD-10-CM | POA: Diagnosis not present

## 2023-11-22 DIAGNOSIS — M160 Bilateral primary osteoarthritis of hip: Secondary | ICD-10-CM | POA: Diagnosis not present

## 2023-11-22 DIAGNOSIS — Z87891 Personal history of nicotine dependence: Secondary | ICD-10-CM | POA: Diagnosis not present

## 2023-11-22 DIAGNOSIS — S71101A Unspecified open wound, right thigh, initial encounter: Secondary | ICD-10-CM | POA: Diagnosis not present

## 2023-11-25 DIAGNOSIS — S71101A Unspecified open wound, right thigh, initial encounter: Secondary | ICD-10-CM | POA: Diagnosis not present

## 2023-11-25 DIAGNOSIS — Z9181 History of falling: Secondary | ICD-10-CM | POA: Diagnosis not present

## 2023-11-25 DIAGNOSIS — M160 Bilateral primary osteoarthritis of hip: Secondary | ICD-10-CM | POA: Diagnosis not present

## 2023-11-25 DIAGNOSIS — E039 Hypothyroidism, unspecified: Secondary | ICD-10-CM | POA: Diagnosis not present

## 2023-11-25 DIAGNOSIS — I110 Hypertensive heart disease with heart failure: Secondary | ICD-10-CM | POA: Diagnosis not present

## 2023-11-25 DIAGNOSIS — Z604 Social exclusion and rejection: Secondary | ICD-10-CM | POA: Diagnosis not present

## 2023-11-25 DIAGNOSIS — I5032 Chronic diastolic (congestive) heart failure: Secondary | ICD-10-CM | POA: Diagnosis not present

## 2023-11-25 DIAGNOSIS — Z87891 Personal history of nicotine dependence: Secondary | ICD-10-CM | POA: Diagnosis not present

## 2023-11-26 DIAGNOSIS — Z9181 History of falling: Secondary | ICD-10-CM | POA: Diagnosis not present

## 2023-11-26 DIAGNOSIS — S71101A Unspecified open wound, right thigh, initial encounter: Secondary | ICD-10-CM | POA: Diagnosis not present

## 2023-11-26 DIAGNOSIS — Z604 Social exclusion and rejection: Secondary | ICD-10-CM | POA: Diagnosis not present

## 2023-11-26 DIAGNOSIS — Z87891 Personal history of nicotine dependence: Secondary | ICD-10-CM | POA: Diagnosis not present

## 2023-11-26 DIAGNOSIS — M160 Bilateral primary osteoarthritis of hip: Secondary | ICD-10-CM | POA: Diagnosis not present

## 2023-11-26 DIAGNOSIS — E039 Hypothyroidism, unspecified: Secondary | ICD-10-CM | POA: Diagnosis not present

## 2023-11-26 DIAGNOSIS — I5032 Chronic diastolic (congestive) heart failure: Secondary | ICD-10-CM | POA: Diagnosis not present

## 2023-11-26 DIAGNOSIS — I110 Hypertensive heart disease with heart failure: Secondary | ICD-10-CM | POA: Diagnosis not present

## 2023-11-27 DIAGNOSIS — Z87891 Personal history of nicotine dependence: Secondary | ICD-10-CM | POA: Diagnosis not present

## 2023-11-27 DIAGNOSIS — Z604 Social exclusion and rejection: Secondary | ICD-10-CM | POA: Diagnosis not present

## 2023-11-27 DIAGNOSIS — E039 Hypothyroidism, unspecified: Secondary | ICD-10-CM | POA: Diagnosis not present

## 2023-11-27 DIAGNOSIS — S71101A Unspecified open wound, right thigh, initial encounter: Secondary | ICD-10-CM | POA: Diagnosis not present

## 2023-11-27 DIAGNOSIS — Z9181 History of falling: Secondary | ICD-10-CM | POA: Diagnosis not present

## 2023-11-27 DIAGNOSIS — I5032 Chronic diastolic (congestive) heart failure: Secondary | ICD-10-CM | POA: Diagnosis not present

## 2023-11-27 DIAGNOSIS — M160 Bilateral primary osteoarthritis of hip: Secondary | ICD-10-CM | POA: Diagnosis not present

## 2023-11-27 DIAGNOSIS — I110 Hypertensive heart disease with heart failure: Secondary | ICD-10-CM | POA: Diagnosis not present

## 2023-11-28 DIAGNOSIS — Z87891 Personal history of nicotine dependence: Secondary | ICD-10-CM | POA: Diagnosis not present

## 2023-11-28 DIAGNOSIS — S71101A Unspecified open wound, right thigh, initial encounter: Secondary | ICD-10-CM | POA: Diagnosis not present

## 2023-11-28 DIAGNOSIS — I5032 Chronic diastolic (congestive) heart failure: Secondary | ICD-10-CM | POA: Diagnosis not present

## 2023-11-28 DIAGNOSIS — M160 Bilateral primary osteoarthritis of hip: Secondary | ICD-10-CM | POA: Diagnosis not present

## 2023-11-28 DIAGNOSIS — Z604 Social exclusion and rejection: Secondary | ICD-10-CM | POA: Diagnosis not present

## 2023-11-28 DIAGNOSIS — Z9181 History of falling: Secondary | ICD-10-CM | POA: Diagnosis not present

## 2023-11-28 DIAGNOSIS — E039 Hypothyroidism, unspecified: Secondary | ICD-10-CM | POA: Diagnosis not present

## 2023-11-28 DIAGNOSIS — I110 Hypertensive heart disease with heart failure: Secondary | ICD-10-CM | POA: Diagnosis not present

## 2023-11-29 DIAGNOSIS — M160 Bilateral primary osteoarthritis of hip: Secondary | ICD-10-CM | POA: Diagnosis not present

## 2023-11-29 DIAGNOSIS — S71101A Unspecified open wound, right thigh, initial encounter: Secondary | ICD-10-CM | POA: Diagnosis not present

## 2023-11-29 DIAGNOSIS — Z9181 History of falling: Secondary | ICD-10-CM | POA: Diagnosis not present

## 2023-11-29 DIAGNOSIS — E039 Hypothyroidism, unspecified: Secondary | ICD-10-CM | POA: Diagnosis not present

## 2023-11-29 DIAGNOSIS — I110 Hypertensive heart disease with heart failure: Secondary | ICD-10-CM | POA: Diagnosis not present

## 2023-11-29 DIAGNOSIS — I5032 Chronic diastolic (congestive) heart failure: Secondary | ICD-10-CM | POA: Diagnosis not present

## 2023-11-29 DIAGNOSIS — Z87891 Personal history of nicotine dependence: Secondary | ICD-10-CM | POA: Diagnosis not present

## 2023-11-29 DIAGNOSIS — Z604 Social exclusion and rejection: Secondary | ICD-10-CM | POA: Diagnosis not present

## 2023-12-02 DIAGNOSIS — Z604 Social exclusion and rejection: Secondary | ICD-10-CM | POA: Diagnosis not present

## 2023-12-02 DIAGNOSIS — Z9181 History of falling: Secondary | ICD-10-CM | POA: Diagnosis not present

## 2023-12-02 DIAGNOSIS — S71101A Unspecified open wound, right thigh, initial encounter: Secondary | ICD-10-CM | POA: Diagnosis not present

## 2023-12-02 DIAGNOSIS — Z87891 Personal history of nicotine dependence: Secondary | ICD-10-CM | POA: Diagnosis not present

## 2023-12-02 DIAGNOSIS — I5032 Chronic diastolic (congestive) heart failure: Secondary | ICD-10-CM | POA: Diagnosis not present

## 2023-12-02 DIAGNOSIS — E039 Hypothyroidism, unspecified: Secondary | ICD-10-CM | POA: Diagnosis not present

## 2023-12-02 DIAGNOSIS — M160 Bilateral primary osteoarthritis of hip: Secondary | ICD-10-CM | POA: Diagnosis not present

## 2023-12-02 DIAGNOSIS — I110 Hypertensive heart disease with heart failure: Secondary | ICD-10-CM | POA: Diagnosis not present

## 2023-12-03 DIAGNOSIS — E039 Hypothyroidism, unspecified: Secondary | ICD-10-CM | POA: Diagnosis not present

## 2023-12-03 DIAGNOSIS — Z87891 Personal history of nicotine dependence: Secondary | ICD-10-CM | POA: Diagnosis not present

## 2023-12-03 DIAGNOSIS — Z604 Social exclusion and rejection: Secondary | ICD-10-CM | POA: Diagnosis not present

## 2023-12-03 DIAGNOSIS — S71101A Unspecified open wound, right thigh, initial encounter: Secondary | ICD-10-CM | POA: Diagnosis not present

## 2023-12-03 DIAGNOSIS — I110 Hypertensive heart disease with heart failure: Secondary | ICD-10-CM | POA: Diagnosis not present

## 2023-12-03 DIAGNOSIS — M160 Bilateral primary osteoarthritis of hip: Secondary | ICD-10-CM | POA: Diagnosis not present

## 2023-12-03 DIAGNOSIS — Z9181 History of falling: Secondary | ICD-10-CM | POA: Diagnosis not present

## 2023-12-03 DIAGNOSIS — I5032 Chronic diastolic (congestive) heart failure: Secondary | ICD-10-CM | POA: Diagnosis not present

## 2023-12-04 DIAGNOSIS — Z79899 Other long term (current) drug therapy: Secondary | ICD-10-CM | POA: Diagnosis not present

## 2023-12-04 DIAGNOSIS — E039 Hypothyroidism, unspecified: Secondary | ICD-10-CM | POA: Diagnosis not present

## 2023-12-04 DIAGNOSIS — M160 Bilateral primary osteoarthritis of hip: Secondary | ICD-10-CM | POA: Diagnosis not present

## 2023-12-04 DIAGNOSIS — I10 Essential (primary) hypertension: Secondary | ICD-10-CM | POA: Diagnosis not present

## 2023-12-04 DIAGNOSIS — I5032 Chronic diastolic (congestive) heart failure: Secondary | ICD-10-CM | POA: Diagnosis not present

## 2023-12-05 DIAGNOSIS — I5032 Chronic diastolic (congestive) heart failure: Secondary | ICD-10-CM | POA: Diagnosis not present

## 2023-12-05 DIAGNOSIS — I110 Hypertensive heart disease with heart failure: Secondary | ICD-10-CM | POA: Diagnosis not present

## 2023-12-05 DIAGNOSIS — Z604 Social exclusion and rejection: Secondary | ICD-10-CM | POA: Diagnosis not present

## 2023-12-05 DIAGNOSIS — S71101A Unspecified open wound, right thigh, initial encounter: Secondary | ICD-10-CM | POA: Diagnosis not present

## 2023-12-05 DIAGNOSIS — E039 Hypothyroidism, unspecified: Secondary | ICD-10-CM | POA: Diagnosis not present

## 2023-12-05 DIAGNOSIS — Z87891 Personal history of nicotine dependence: Secondary | ICD-10-CM | POA: Diagnosis not present

## 2023-12-05 DIAGNOSIS — Z9181 History of falling: Secondary | ICD-10-CM | POA: Diagnosis not present

## 2023-12-05 DIAGNOSIS — M160 Bilateral primary osteoarthritis of hip: Secondary | ICD-10-CM | POA: Diagnosis not present

## 2023-12-06 DIAGNOSIS — E039 Hypothyroidism, unspecified: Secondary | ICD-10-CM | POA: Diagnosis not present

## 2023-12-06 DIAGNOSIS — Z9181 History of falling: Secondary | ICD-10-CM | POA: Diagnosis not present

## 2023-12-06 DIAGNOSIS — Z87891 Personal history of nicotine dependence: Secondary | ICD-10-CM | POA: Diagnosis not present

## 2023-12-06 DIAGNOSIS — Z604 Social exclusion and rejection: Secondary | ICD-10-CM | POA: Diagnosis not present

## 2023-12-06 DIAGNOSIS — S71101A Unspecified open wound, right thigh, initial encounter: Secondary | ICD-10-CM | POA: Diagnosis not present

## 2023-12-06 DIAGNOSIS — I5032 Chronic diastolic (congestive) heart failure: Secondary | ICD-10-CM | POA: Diagnosis not present

## 2023-12-06 DIAGNOSIS — I110 Hypertensive heart disease with heart failure: Secondary | ICD-10-CM | POA: Diagnosis not present

## 2023-12-06 DIAGNOSIS — M160 Bilateral primary osteoarthritis of hip: Secondary | ICD-10-CM | POA: Diagnosis not present

## 2023-12-10 DIAGNOSIS — Z87891 Personal history of nicotine dependence: Secondary | ICD-10-CM | POA: Diagnosis not present

## 2023-12-10 DIAGNOSIS — E039 Hypothyroidism, unspecified: Secondary | ICD-10-CM | POA: Diagnosis not present

## 2023-12-10 DIAGNOSIS — Z604 Social exclusion and rejection: Secondary | ICD-10-CM | POA: Diagnosis not present

## 2023-12-10 DIAGNOSIS — Z9181 History of falling: Secondary | ICD-10-CM | POA: Diagnosis not present

## 2023-12-10 DIAGNOSIS — I110 Hypertensive heart disease with heart failure: Secondary | ICD-10-CM | POA: Diagnosis not present

## 2023-12-10 DIAGNOSIS — S71101A Unspecified open wound, right thigh, initial encounter: Secondary | ICD-10-CM | POA: Diagnosis not present

## 2023-12-10 DIAGNOSIS — M160 Bilateral primary osteoarthritis of hip: Secondary | ICD-10-CM | POA: Diagnosis not present

## 2023-12-10 DIAGNOSIS — I5032 Chronic diastolic (congestive) heart failure: Secondary | ICD-10-CM | POA: Diagnosis not present

## 2023-12-12 DIAGNOSIS — Z87891 Personal history of nicotine dependence: Secondary | ICD-10-CM | POA: Diagnosis not present

## 2023-12-12 DIAGNOSIS — M160 Bilateral primary osteoarthritis of hip: Secondary | ICD-10-CM | POA: Diagnosis not present

## 2023-12-12 DIAGNOSIS — Z9181 History of falling: Secondary | ICD-10-CM | POA: Diagnosis not present

## 2023-12-12 DIAGNOSIS — I5032 Chronic diastolic (congestive) heart failure: Secondary | ICD-10-CM | POA: Diagnosis not present

## 2023-12-12 DIAGNOSIS — S71101A Unspecified open wound, right thigh, initial encounter: Secondary | ICD-10-CM | POA: Diagnosis not present

## 2023-12-12 DIAGNOSIS — Z604 Social exclusion and rejection: Secondary | ICD-10-CM | POA: Diagnosis not present

## 2023-12-12 DIAGNOSIS — I110 Hypertensive heart disease with heart failure: Secondary | ICD-10-CM | POA: Diagnosis not present

## 2023-12-12 DIAGNOSIS — E039 Hypothyroidism, unspecified: Secondary | ICD-10-CM | POA: Diagnosis not present

## 2023-12-16 DIAGNOSIS — I110 Hypertensive heart disease with heart failure: Secondary | ICD-10-CM | POA: Diagnosis not present

## 2023-12-16 DIAGNOSIS — Z87891 Personal history of nicotine dependence: Secondary | ICD-10-CM | POA: Diagnosis not present

## 2023-12-16 DIAGNOSIS — Z9181 History of falling: Secondary | ICD-10-CM | POA: Diagnosis not present

## 2023-12-16 DIAGNOSIS — I5032 Chronic diastolic (congestive) heart failure: Secondary | ICD-10-CM | POA: Diagnosis not present

## 2023-12-16 DIAGNOSIS — Z604 Social exclusion and rejection: Secondary | ICD-10-CM | POA: Diagnosis not present

## 2023-12-16 DIAGNOSIS — S71101A Unspecified open wound, right thigh, initial encounter: Secondary | ICD-10-CM | POA: Diagnosis not present

## 2023-12-16 DIAGNOSIS — M160 Bilateral primary osteoarthritis of hip: Secondary | ICD-10-CM | POA: Diagnosis not present

## 2023-12-16 DIAGNOSIS — E039 Hypothyroidism, unspecified: Secondary | ICD-10-CM | POA: Diagnosis not present

## 2023-12-19 DIAGNOSIS — S71101A Unspecified open wound, right thigh, initial encounter: Secondary | ICD-10-CM | POA: Diagnosis not present

## 2023-12-19 DIAGNOSIS — Z9181 History of falling: Secondary | ICD-10-CM | POA: Diagnosis not present

## 2023-12-19 DIAGNOSIS — M160 Bilateral primary osteoarthritis of hip: Secondary | ICD-10-CM | POA: Diagnosis not present

## 2023-12-19 DIAGNOSIS — Z87891 Personal history of nicotine dependence: Secondary | ICD-10-CM | POA: Diagnosis not present

## 2023-12-19 DIAGNOSIS — Z604 Social exclusion and rejection: Secondary | ICD-10-CM | POA: Diagnosis not present

## 2023-12-19 DIAGNOSIS — I5032 Chronic diastolic (congestive) heart failure: Secondary | ICD-10-CM | POA: Diagnosis not present

## 2023-12-19 DIAGNOSIS — I110 Hypertensive heart disease with heart failure: Secondary | ICD-10-CM | POA: Diagnosis not present

## 2023-12-19 DIAGNOSIS — E039 Hypothyroidism, unspecified: Secondary | ICD-10-CM | POA: Diagnosis not present

## 2023-12-23 DIAGNOSIS — S71101A Unspecified open wound, right thigh, initial encounter: Secondary | ICD-10-CM | POA: Diagnosis not present

## 2023-12-23 DIAGNOSIS — M160 Bilateral primary osteoarthritis of hip: Secondary | ICD-10-CM | POA: Diagnosis not present

## 2023-12-23 DIAGNOSIS — I5032 Chronic diastolic (congestive) heart failure: Secondary | ICD-10-CM | POA: Diagnosis not present

## 2023-12-23 DIAGNOSIS — Z604 Social exclusion and rejection: Secondary | ICD-10-CM | POA: Diagnosis not present

## 2023-12-23 DIAGNOSIS — Z9181 History of falling: Secondary | ICD-10-CM | POA: Diagnosis not present

## 2023-12-23 DIAGNOSIS — Z87891 Personal history of nicotine dependence: Secondary | ICD-10-CM | POA: Diagnosis not present

## 2023-12-23 DIAGNOSIS — I110 Hypertensive heart disease with heart failure: Secondary | ICD-10-CM | POA: Diagnosis not present

## 2023-12-23 DIAGNOSIS — E039 Hypothyroidism, unspecified: Secondary | ICD-10-CM | POA: Diagnosis not present

## 2023-12-24 DIAGNOSIS — Z604 Social exclusion and rejection: Secondary | ICD-10-CM | POA: Diagnosis not present

## 2023-12-24 DIAGNOSIS — M160 Bilateral primary osteoarthritis of hip: Secondary | ICD-10-CM | POA: Diagnosis not present

## 2023-12-24 DIAGNOSIS — Z87891 Personal history of nicotine dependence: Secondary | ICD-10-CM | POA: Diagnosis not present

## 2023-12-24 DIAGNOSIS — I110 Hypertensive heart disease with heart failure: Secondary | ICD-10-CM | POA: Diagnosis not present

## 2023-12-24 DIAGNOSIS — Z9181 History of falling: Secondary | ICD-10-CM | POA: Diagnosis not present

## 2023-12-24 DIAGNOSIS — E039 Hypothyroidism, unspecified: Secondary | ICD-10-CM | POA: Diagnosis not present

## 2023-12-24 DIAGNOSIS — I5032 Chronic diastolic (congestive) heart failure: Secondary | ICD-10-CM | POA: Diagnosis not present

## 2023-12-27 DIAGNOSIS — E039 Hypothyroidism, unspecified: Secondary | ICD-10-CM | POA: Diagnosis not present

## 2023-12-27 DIAGNOSIS — Z87891 Personal history of nicotine dependence: Secondary | ICD-10-CM | POA: Diagnosis not present

## 2023-12-27 DIAGNOSIS — I110 Hypertensive heart disease with heart failure: Secondary | ICD-10-CM | POA: Diagnosis not present

## 2023-12-27 DIAGNOSIS — Z604 Social exclusion and rejection: Secondary | ICD-10-CM | POA: Diagnosis not present

## 2023-12-27 DIAGNOSIS — Z9181 History of falling: Secondary | ICD-10-CM | POA: Diagnosis not present

## 2023-12-27 DIAGNOSIS — I5032 Chronic diastolic (congestive) heart failure: Secondary | ICD-10-CM | POA: Diagnosis not present

## 2023-12-27 DIAGNOSIS — M160 Bilateral primary osteoarthritis of hip: Secondary | ICD-10-CM | POA: Diagnosis not present

## 2023-12-30 DIAGNOSIS — Z87891 Personal history of nicotine dependence: Secondary | ICD-10-CM | POA: Diagnosis not present

## 2023-12-30 DIAGNOSIS — E039 Hypothyroidism, unspecified: Secondary | ICD-10-CM | POA: Diagnosis not present

## 2023-12-30 DIAGNOSIS — M160 Bilateral primary osteoarthritis of hip: Secondary | ICD-10-CM | POA: Diagnosis not present

## 2023-12-30 DIAGNOSIS — Z604 Social exclusion and rejection: Secondary | ICD-10-CM | POA: Diagnosis not present

## 2023-12-30 DIAGNOSIS — Z9181 History of falling: Secondary | ICD-10-CM | POA: Diagnosis not present

## 2023-12-30 DIAGNOSIS — I5032 Chronic diastolic (congestive) heart failure: Secondary | ICD-10-CM | POA: Diagnosis not present

## 2023-12-30 DIAGNOSIS — I110 Hypertensive heart disease with heart failure: Secondary | ICD-10-CM | POA: Diagnosis not present

## 2024-01-03 DIAGNOSIS — M160 Bilateral primary osteoarthritis of hip: Secondary | ICD-10-CM | POA: Diagnosis not present

## 2024-01-03 DIAGNOSIS — I5032 Chronic diastolic (congestive) heart failure: Secondary | ICD-10-CM | POA: Diagnosis not present

## 2024-01-03 DIAGNOSIS — Z604 Social exclusion and rejection: Secondary | ICD-10-CM | POA: Diagnosis not present

## 2024-01-03 DIAGNOSIS — I110 Hypertensive heart disease with heart failure: Secondary | ICD-10-CM | POA: Diagnosis not present

## 2024-01-03 DIAGNOSIS — Z9181 History of falling: Secondary | ICD-10-CM | POA: Diagnosis not present

## 2024-01-03 DIAGNOSIS — Z87891 Personal history of nicotine dependence: Secondary | ICD-10-CM | POA: Diagnosis not present

## 2024-01-03 DIAGNOSIS — E039 Hypothyroidism, unspecified: Secondary | ICD-10-CM | POA: Diagnosis not present

## 2024-01-06 DIAGNOSIS — Z604 Social exclusion and rejection: Secondary | ICD-10-CM | POA: Diagnosis not present

## 2024-01-06 DIAGNOSIS — Z87891 Personal history of nicotine dependence: Secondary | ICD-10-CM | POA: Diagnosis not present

## 2024-01-06 DIAGNOSIS — I5032 Chronic diastolic (congestive) heart failure: Secondary | ICD-10-CM | POA: Diagnosis not present

## 2024-01-06 DIAGNOSIS — M160 Bilateral primary osteoarthritis of hip: Secondary | ICD-10-CM | POA: Diagnosis not present

## 2024-01-06 DIAGNOSIS — I110 Hypertensive heart disease with heart failure: Secondary | ICD-10-CM | POA: Diagnosis not present

## 2024-01-06 DIAGNOSIS — E039 Hypothyroidism, unspecified: Secondary | ICD-10-CM | POA: Diagnosis not present

## 2024-01-06 DIAGNOSIS — Z9181 History of falling: Secondary | ICD-10-CM | POA: Diagnosis not present

## 2024-01-09 DIAGNOSIS — Z87891 Personal history of nicotine dependence: Secondary | ICD-10-CM | POA: Diagnosis not present

## 2024-01-09 DIAGNOSIS — Z9181 History of falling: Secondary | ICD-10-CM | POA: Diagnosis not present

## 2024-01-09 DIAGNOSIS — M160 Bilateral primary osteoarthritis of hip: Secondary | ICD-10-CM | POA: Diagnosis not present

## 2024-01-09 DIAGNOSIS — I5032 Chronic diastolic (congestive) heart failure: Secondary | ICD-10-CM | POA: Diagnosis not present

## 2024-01-09 DIAGNOSIS — Z604 Social exclusion and rejection: Secondary | ICD-10-CM | POA: Diagnosis not present

## 2024-01-09 DIAGNOSIS — E039 Hypothyroidism, unspecified: Secondary | ICD-10-CM | POA: Diagnosis not present

## 2024-01-09 DIAGNOSIS — I110 Hypertensive heart disease with heart failure: Secondary | ICD-10-CM | POA: Diagnosis not present

## 2024-01-16 DIAGNOSIS — Z87891 Personal history of nicotine dependence: Secondary | ICD-10-CM | POA: Diagnosis not present

## 2024-01-16 DIAGNOSIS — I110 Hypertensive heart disease with heart failure: Secondary | ICD-10-CM | POA: Diagnosis not present

## 2024-01-16 DIAGNOSIS — Z9181 History of falling: Secondary | ICD-10-CM | POA: Diagnosis not present

## 2024-01-16 DIAGNOSIS — Z604 Social exclusion and rejection: Secondary | ICD-10-CM | POA: Diagnosis not present

## 2024-01-16 DIAGNOSIS — I5032 Chronic diastolic (congestive) heart failure: Secondary | ICD-10-CM | POA: Diagnosis not present

## 2024-01-16 DIAGNOSIS — M160 Bilateral primary osteoarthritis of hip: Secondary | ICD-10-CM | POA: Diagnosis not present

## 2024-01-16 DIAGNOSIS — E039 Hypothyroidism, unspecified: Secondary | ICD-10-CM | POA: Diagnosis not present

## 2024-01-22 DIAGNOSIS — Z87891 Personal history of nicotine dependence: Secondary | ICD-10-CM | POA: Diagnosis not present

## 2024-01-22 DIAGNOSIS — Z604 Social exclusion and rejection: Secondary | ICD-10-CM | POA: Diagnosis not present

## 2024-01-22 DIAGNOSIS — I5032 Chronic diastolic (congestive) heart failure: Secondary | ICD-10-CM | POA: Diagnosis not present

## 2024-01-22 DIAGNOSIS — I110 Hypertensive heart disease with heart failure: Secondary | ICD-10-CM | POA: Diagnosis not present

## 2024-01-22 DIAGNOSIS — Z9181 History of falling: Secondary | ICD-10-CM | POA: Diagnosis not present

## 2024-01-22 DIAGNOSIS — E039 Hypothyroidism, unspecified: Secondary | ICD-10-CM | POA: Diagnosis not present

## 2024-01-22 DIAGNOSIS — M160 Bilateral primary osteoarthritis of hip: Secondary | ICD-10-CM | POA: Diagnosis not present

## 2024-01-28 DIAGNOSIS — I110 Hypertensive heart disease with heart failure: Secondary | ICD-10-CM | POA: Diagnosis not present

## 2024-01-28 DIAGNOSIS — I5032 Chronic diastolic (congestive) heart failure: Secondary | ICD-10-CM | POA: Diagnosis not present

## 2024-01-28 DIAGNOSIS — Z9181 History of falling: Secondary | ICD-10-CM | POA: Diagnosis not present

## 2024-01-28 DIAGNOSIS — M160 Bilateral primary osteoarthritis of hip: Secondary | ICD-10-CM | POA: Diagnosis not present

## 2024-01-28 DIAGNOSIS — Z604 Social exclusion and rejection: Secondary | ICD-10-CM | POA: Diagnosis not present

## 2024-01-28 DIAGNOSIS — E039 Hypothyroidism, unspecified: Secondary | ICD-10-CM | POA: Diagnosis not present

## 2024-01-28 DIAGNOSIS — Z87891 Personal history of nicotine dependence: Secondary | ICD-10-CM | POA: Diagnosis not present

## 2024-02-11 DIAGNOSIS — M6281 Muscle weakness (generalized): Secondary | ICD-10-CM | POA: Diagnosis not present

## 2024-02-11 DIAGNOSIS — R2689 Other abnormalities of gait and mobility: Secondary | ICD-10-CM | POA: Diagnosis not present

## 2024-02-14 ENCOUNTER — Institutional Professional Consult (permissible substitution): Admitting: Plastic Surgery

## 2024-03-11 DIAGNOSIS — Z0000 Encounter for general adult medical examination without abnormal findings: Secondary | ICD-10-CM | POA: Diagnosis not present

## 2024-03-11 DIAGNOSIS — Z1389 Encounter for screening for other disorder: Secondary | ICD-10-CM | POA: Diagnosis not present

## 2024-03-11 DIAGNOSIS — R262 Difficulty in walking, not elsewhere classified: Secondary | ICD-10-CM | POA: Diagnosis not present

## 2024-03-31 DIAGNOSIS — I10 Essential (primary) hypertension: Secondary | ICD-10-CM | POA: Diagnosis not present

## 2024-03-31 DIAGNOSIS — E039 Hypothyroidism, unspecified: Secondary | ICD-10-CM | POA: Diagnosis not present

## 2024-03-31 DIAGNOSIS — E7800 Pure hypercholesterolemia, unspecified: Secondary | ICD-10-CM | POA: Diagnosis not present

## 2024-03-31 DIAGNOSIS — R7301 Impaired fasting glucose: Secondary | ICD-10-CM | POA: Diagnosis not present
